# Patient Record
Sex: Male | Born: 1970
Health system: Southern US, Community
[De-identification: ages and names within clinical notes are randomized; demographics above are authoritative.]

## PROBLEM LIST (undated history)

## (undated) DIAGNOSIS — R7303 Prediabetes: Secondary | ICD-10-CM

## (undated) DIAGNOSIS — T7840XA Allergy, unspecified, initial encounter: Secondary | ICD-10-CM

## (undated) DIAGNOSIS — E739 Lactose intolerance, unspecified: Secondary | ICD-10-CM

## (undated) HISTORY — DX: Allergy, unspecified, initial encounter: T78.40XA

## (undated) HISTORY — PX: FRACTURE SURGERY: SHX138

## (undated) HISTORY — DX: Prediabetes: R73.03

## (undated) HISTORY — DX: Lactose intolerance, unspecified: E73.9

---

## 2010-01-25 ENCOUNTER — Emergency Department (HOSPITAL_COMMUNITY)
Admission: EM | Admit: 2010-01-25 | Discharge: 2010-01-26 | Payer: Self-pay | Source: Home / Self Care | Admitting: Emergency Medicine

## 2010-05-14 LAB — D-DIMER, QUANTITATIVE: D-Dimer, Quant: <0.22 ug/mL-FEU (ref 0.00–0.48)

## 2011-01-17 ENCOUNTER — Emergency Department (HOSPITAL_COMMUNITY): Payer: Self-pay

## 2011-01-17 ENCOUNTER — Encounter: Payer: Self-pay | Admitting: Emergency Medicine

## 2011-01-17 ENCOUNTER — Emergency Department (HOSPITAL_COMMUNITY)
Admission: EM | Admit: 2011-01-17 | Discharge: 2011-01-17 | Disposition: A | Discharge status: Home / Self Care | Payer: Self-pay | Attending: Emergency Medicine | Admitting: Emergency Medicine

## 2011-01-17 DIAGNOSIS — R229 Localized swelling, mass and lump, unspecified: Secondary | ICD-10-CM | POA: Insufficient documentation

## 2011-01-17 DIAGNOSIS — IMO0002 Reserved for concepts with insufficient information to code with codable children: Secondary | ICD-10-CM | POA: Insufficient documentation

## 2011-01-17 DIAGNOSIS — M79609 Pain in unspecified limb: Secondary | ICD-10-CM | POA: Insufficient documentation

## 2011-01-17 DIAGNOSIS — M795 Residual foreign body in soft tissue: Secondary | ICD-10-CM

## 2011-01-17 DIAGNOSIS — X58XXXA Exposure to other specified factors, initial encounter: Secondary | ICD-10-CM | POA: Insufficient documentation

## 2011-01-17 MED ORDER — TETANUS-DIPHTH-ACELL PERTUSSIS 5-2.5-18.5 LF-MCG/0.5 IM SUSP
0.5000 mL | Freq: Once | INTRAMUSCULAR | Status: AC
  Administered 2011-01-17: 0.5 mL via INTRAMUSCULAR
  Filled 2011-01-17: qty 0.5

## 2011-01-17 MED ORDER — CEPHALEXIN 500 MG PO CAPS: 500.0000 mg | ORAL_CAPSULE | Freq: Four times a day (QID) | ORAL | Status: AC

## 2011-01-17 NOTE — ED Notes (Signed)
Pressure dressing placed over R wrist wound. Pt tolerated without difficulty. Bleeding controlled. Wound care instructions discussed. Pt had no further questions regarding discharge.

## 2011-01-17 NOTE — ED Provider Notes (Signed)
Medical screening examination/treatment/procedure(s) were performed by non-physician practitioner and as supervising physician I was immediately available for consultation/collaboration.  Donnetta Hutching, MD 01/17/11 [phone]

## 2011-01-17 NOTE — ED Notes (Signed)
Patient transported to X-ray 

## 2011-01-17 NOTE — ED Provider Notes (Signed)
History     CSN: [id number] Arrival date & time: 01/17/2011  6:13 PM   First MD Initiated Contact with Patient 01/17/11 2029      Chief Complaint  Patient presents with  . Arm Pain  HPI: Patient is a 40 y.o. male presenting with arm pain and foreign body. The history is provided by the patient. No language interpreter was used.  Arm Pain This is a new problem. The current episode started yesterday. The problem occurs constantly. The problem has been gradually worsening. Pertinent negatives include no fever. He has tried nothing for the symptoms.  Foreign Body  The incident was reported. Pertinent negatives include no fever.  Patient reports is working on an old fence yesterday at home when a small piece of wood penetrated his right forearm. Became concerned today when patient noted the area to be somewhat swollen red and and tender to palpation.  History reviewed. No pertinent past medical history.  History reviewed. No pertinent past surgical history.  No family history on file.  History  Substance Use Topics  . Smoking status: Never Smoker   . Smokeless tobacco: Not on file  . Alcohol Use: No      Review of Systems  Constitutional: Negative.  Negative for fever.  HENT: Negative.   Eyes: Negative.   Respiratory: Negative.   Cardiovascular: Negative.   Gastrointestinal: Negative.   Genitourinary: Negative.   Musculoskeletal: Negative.   Skin: Negative.   Neurological: Negative.   Hematological: Negative.   Psychiatric/Behavioral: Negative.     Allergies  Review of patient's allergies indicates no known allergies.  Home Medications  No current outpatient prescriptions on file.  BP 122/77  Pulse 50  Temp(Src) 96.8 F (36 C) (Oral)  Resp 16  SpO2 98%  Physical Exam  Constitutional: He is oriented to person, place, and time. He appears well-developed and well-nourished.  HENT:  Head: Normocephalic and atraumatic.  Eyes: Conjunctivae are normal.  Neck:  Neck supple.  Cardiovascular: Normal rate.   Pulmonary/Chest: Effort normal and breath sounds normal.  Musculoskeletal:       Right forearm: He exhibits tenderness and swelling.       Arms:      Puncture wound noted to the radial aspect of (R) forearm with small amount of erythema and swelling surrounding the puncture wound. Able to palpate likely foreign body just beneath.  Neurological: He is alert and oriented to person, place, and time.  Skin: Skin is warm and dry.  Psychiatric: He has a normal mood and affect.    ED Course  FOREIGN BODY REMOVAL Date/Time: 01/17/2011 11:17 PM Performed by: Leanne Chang Authorized by: Leanne Chang Consent: Written consent obtained. Risks and benefits: risks, benefits and alternatives were discussed Consent given by: patient Patient understanding: patient states understanding of the procedure being performed Site marked: the operative site was marked Imaging studies: imaging studies available Patient identity confirmed: verbally with patient and arm band Intake: (R) radial forearm. Anesthesia: local infiltration Local anesthetic: lidocaine 1% without epinephrine Anesthetic total: 1 ml Patient sedated: no Patient restrained: no Complexity: simple 1 objects recovered. Objects recovered: Approx .5 cm ood (splinter) Post-procedure assessment: foreign body removed Patient tolerance: Patient tolerated the procedure well with no immediate complications. Comments: Small incision <.25cm left open. Large bulky sterile dressing applied. T-Dap updated     Labs Reviewed - No data to display Dg Forearm Right  01/17/2011  *RADIOLOGY REPORT*  Clinical Data: Evaluate for foreign body.  Laceration along the  radial aspect of the right forearm.  RIGHT FOREARM - 2 VIEW  Comparison: None.  Findings: No acute osseous abnormality.  No foreign body identified however not that wood is often not radiopaque.  IMPRESSION: No osseous abnormality.  No  radiopaque foreign body.  Note that wood can be radiographically occult.  Original Report Authenticated By: Waneta Martins, M.D.     No diagnosis found.    MDM  FB removed. Will tx w/ abx given swelling erythema.         Leanne Chang, NP 01/17/11 [phone]

## 2011-01-17 NOTE — ED Notes (Signed)
Pt states small piece of wood went into R wrist area yesterday. States he was doing wood working at home when this happened. Swollen area to R posterior wrist, area red and tender to palpation. No drainage noted. Pt conscious alert and oriented x4. No fever. No other complaints. 3/10 mild pain at the time. No signs of distress at the time.

## 2011-01-17 NOTE — ED Notes (Signed)
EDP at the bedside attempting to remove foreign body. Small piece of wood removed without difficulty. Pt tolerated without problems. Bleeding controlled. Area covered with pressure dressing.

## 2011-01-17 NOTE — ED Notes (Signed)
Patient currently sitting up in bed; no respiratory or acute distress noted.  Patient updated on plan of care; informed patient that x-ray will be by to transport patient to x-ray.  Patient has no other questions, concerns, or requests at this time.  Will continue to monitor.

## 2011-01-17 NOTE — ED Notes (Signed)
Pt states a small piece of wood went into R forearm while working at home yesterday.  C/o pain and swelling.

## 2011-06-29 ENCOUNTER — Ambulatory Visit (INDEPENDENT_AMBULATORY_CARE_PROVIDER_SITE_OTHER): Payer: BC Managed Care – PPO | Admitting: Physician Assistant

## 2011-06-29 DIAGNOSIS — R062 Wheezing: Secondary | ICD-10-CM

## 2011-06-29 DIAGNOSIS — J309 Allergic rhinitis, unspecified: Secondary | ICD-10-CM | POA: Insufficient documentation

## 2011-06-29 DIAGNOSIS — J45909 Unspecified asthma, uncomplicated: Secondary | ICD-10-CM

## 2011-06-29 DIAGNOSIS — J301 Allergic rhinitis due to pollen: Secondary | ICD-10-CM

## 2011-06-29 MED ORDER — ALBUTEROL SULFATE HFA 108 (90 BASE) MCG/ACT IN AERS: 2.0000 | INHALATION_SPRAY | RESPIRATORY_TRACT | Status: DC | PRN

## 2011-06-29 MED ORDER — ALBUTEROL SULFATE (2.5 MG/3ML) 0.083% IN NEBU
2.5000 mg | INHALATION_SOLUTION | Freq: Once | RESPIRATORY_TRACT | Status: AC
  Administered 2011-06-29: 2.5 mg via RESPIRATORY_TRACT

## 2011-06-29 MED ORDER — PREDNISONE 20 MG PO TABS: ORAL_TABLET | ORAL | Status: AC

## 2011-06-29 MED ORDER — OLOPATADINE HCL 0.2 % OP SOLN: OPHTHALMIC | Status: DC

## 2011-06-29 MED ORDER — SODIUM CHLORIDE 0.9 % IV SOLN: 125.0000 mg | Freq: Once | INTRAVENOUS | Status: DC

## 2011-06-29 MED ORDER — FLUTICASONE PROPIONATE 50 MCG/ACT NA SUSP: 2.0000 | Freq: Every day | NASAL | Status: DC

## 2011-06-29 MED ORDER — IPRATROPIUM BROMIDE 0.02 % IN SOLN
0.5000 mg | Freq: Once | RESPIRATORY_TRACT | Status: AC
  Administered 2011-06-29: 0.5 mg via RESPIRATORY_TRACT

## 2011-06-29 MED ORDER — SODIUM CHLORIDE 0.9 % IV SOLN
125.0000 mg | Freq: Once | INTRAVENOUS | Status: AC
  Administered 2011-06-29: 130 mg via INTRAMUSCULAR

## 2011-06-29 NOTE — Progress Notes (Signed)
Patient ID: Bradley Flores MRN: [id number], DOB: October 10, 1970, 41 y.o. Date of Encounter: 06/29/2011, 6:22 PM  Primary Physician: No primary provider on file.  Chief Complaint: Allergic rhinitis and asthma  HPI: 41 y.o. year old male with history below presents with flare up of allergic rhinitis and asthma exacerbation. Patient reports he has experienced for the past 7 days symptoms of sneezing, watery/itchy eyes, nasal congestion, rhinorrhea, PND, and ST. Afebrile. Mild cough coupled with some SOB and wheezing. He does have a history of asthma, although this has been well controlled for some time. His asthma has previously been treated with albuterol successfully. He denies any CP, palpitations, or edema. No recent sedentary periods, leg trauma, tobacco use, or history of CA.   Past Medical History  Diagnosis Date  . Asthma   . Allergic rhinitis      Home Meds: Prior to Admission medications   Not on File    Allergies: No Known Allergies  History   Social History  . Marital Status: Married    Spouse Name: N/A    Number of Children: N/A  . Years of Education: N/A   Occupational History  . Not on file.   Social History Main Topics  . Smoking status: Never Smoker   . Smokeless tobacco: Not on file  . Alcohol Use: No  . Drug Use: No  . Sexually Active:    Other Topics Concern  . Not on file   Social History Narrative  . No narrative on file     Review of Systems: Constitutional: negative for chills, fever, night sweats, weight changes, or fatigue  HEENT: negative for vision changes, hearing loss, congestion, rhinorrhea, ST, epistaxis, or sinus pressure Cardiovascular: negative for chest pain or palpitations Respiratory: negative for hemoptysis Abdominal: negative for abdominal pain, nausea, vomiting, diarrhea, or constipation Dermatological: negative for rash Neurologic: negative for headache, dizziness, or syncope All other systems reviewed and are otherwise  negative with the exception to those above and in the HPI.   Physical Exam: Blood pressure 127/69, pulse 68, temperature 98.7 F (37.1 C), temperature source Oral, resp. rate 16, height 6' 2.18" (1.884 m), weight 223 lb 9.6 oz (101.424 kg), SpO2 97.00%., Body mass index is 28.57 kg/(m^2). General: Well developed, well nourished, in no acute distress. Head: Normocephalic, atraumatic, eyes without discharge, sclera non-icteric, nares are pale and boggy. Bilateral auditory canals clear, TM's are without perforation, pearly grey and translucent with reflective cone of light bilaterally. Oral cavity moist, posterior pharynx without exudate, erythema, peritonsillar abscess, or post nasal drip.  Neck: Supple. No thyromegaly. Full ROM. No lymphadenopathy. Lungs: Clear bilaterally to auscultation with inspiratory and expiratory wheezes. No rales or rhonchi. Breathing is unlabored. Heart: RRR with S1 S2. No murmurs, rubs, or gallops appreciated. Msk:  Strength and tone normal for age. Extremities/Skin: Warm and dry. No clubbing or cyanosis. No edema. No rashes or suspicious lesions. Neuro: Alert and oriented X 3. Moves all extremities spontaneously. Gait is normal. CNII-XII grossly in tact. Psych:  Responds to questions appropriately with a normal affect.   S/P Albuterol neb: Patient feels better. No further wheezing to auscultation.  ASSESSMENT AND PLAN:  41 y.o. year old male with allergic rhinitis and asthma. -Albuterol and Atrovent neb in office -SoluMedrol 125 mg IM in office -Flonase 2 sprays each nare daily #1 RF 6 -Pataday #1 1 gtt daily RF 6 -Prednisone 20 mg #18 3x3, 2x3, 1x3 no RF -Proventil 2 puffs inhaled q 4-6 hours prn #1 RF  1 -RTC precautions -Recheck 48 hours     Signed, Eula Listen, PA-C 06/29/2011 6:22 PM

## 2012-06-14 ENCOUNTER — Ambulatory Visit (INDEPENDENT_AMBULATORY_CARE_PROVIDER_SITE_OTHER): Payer: BC Managed Care – PPO | Admitting: Emergency Medicine

## 2012-06-14 VITALS — BP 135/80 | HR 75 | Temp 98.8°F | Resp 16 | Ht 74.18 in | Wt 223.0 lb

## 2012-06-14 DIAGNOSIS — H1045 Other chronic allergic conjunctivitis: Secondary | ICD-10-CM

## 2012-06-14 DIAGNOSIS — J309 Allergic rhinitis, unspecified: Secondary | ICD-10-CM

## 2012-06-14 DIAGNOSIS — H1013 Acute atopic conjunctivitis, bilateral: Secondary | ICD-10-CM

## 2012-06-14 DIAGNOSIS — R062 Wheezing: Secondary | ICD-10-CM

## 2012-06-14 MED ORDER — FLUTICASONE PROPIONATE 50 MCG/ACT NA SUSP: 2.0000 | Freq: Every day | NASAL | Status: AC

## 2012-06-14 MED ORDER — OLOPATADINE HCL 0.2 % OP SOLN: OPHTHALMIC | Status: DC

## 2012-06-14 NOTE — Patient Instructions (Addendum)
Allergic Rhinitis Allergic rhinitis is when the mucous membranes in the nose respond to allergens. Allergens are particles in the air that cause your body to have an allergic reaction. This causes you to release allergic antibodies. Through a chain of events, these eventually cause you to release histamine into the blood stream (hence the use of antihistamines). Although meant to be protective to the body, it is this release that causes your discomfort, such as frequent sneezing, congestion and an itchy runny nose.  CAUSES  The pollen allergens may come from grasses, trees, and weeds. This is seasonal allergic rhinitis, or "hay fever." Other allergens cause year-round allergic rhinitis (perennial allergic rhinitis) such as house dust mite allergen, pet dander and mold spores.  SYMPTOMS   Nasal stuffiness (congestion).  Runny, itchy nose with sneezing and tearing of the eyes.  There is often an itching of the mouth, eyes and ears. It cannot be cured, but it can be controlled with medications. DIAGNOSIS  If you are unable to determine the offending allergen, skin or blood testing may find it. TREATMENT   Avoid the allergen.  Medications and allergy shots (immunotherapy) can help.  Hay fever may often be treated with antihistamines in pill or nasal spray forms. Antihistamines block the effects of histamine. There are over-the-counter medicines that may help with nasal congestion and swelling around the eyes. Check with your caregiver before taking or giving this medicine. If the treatment above does not work, there are many new medications your caregiver can prescribe. Stronger medications may be used if initial measures are ineffective. Desensitizing injections can be used if medications and avoidance fails. Desensitization is when a patient is given ongoing shots until the body becomes less sensitive to the allergen. Make sure you follow up with your caregiver if problems continue. SEEK MEDICAL  CARE IF:   You develop fever (more than 100.5 F (38.1 C).  You develop a cough that does not stop easily (persistent).  You have shortness of breath.  You start wheezing.  Symptoms interfere with normal daily activities. Document Released: 11/12/2000 Document Revised: 05/12/2011 Document Reviewed: 05/24/2008 [facility] Patient Information 2013 [place], [place]. Allergic Conjunctivitis The conjunctiva is a thin membrane that covers the visible white part of the eyeball and the underside of the eyelids. This membrane protects and lubricates the eye. The membrane has small blood vessels running through it that can normally be seen. When the conjunctiva becomes inflamed, the condition is called conjunctivitis. In response to the inflammation, the conjunctival blood vessels become swollen. The swelling results in redness in the normally white part of the eye. The blood vessels of this membrane also react when a person has allergies and is then called allergic conjunctivitis. This condition usually lasts for as long as the allergy persists. Allergic conjunctivitis cannot be passed to another person (non-contagious). The likelihood of bacterial infection is great and the cause is not likely due to allergies if the inflamed eye has:  A sticky discharge.  Discharge or sticking together of the lids in the morning.  Scaling or flaking of the eyelids where the eyelashes come out.  Red swollen eyelids. CAUSES   Viruses.  Irritants such as foreign bodies.  Chemicals.  General allergic reactions.  Inflammation or serious diseases in the inside or the outside of the eye or the orbit (the boney cavity in which the eye sits) can cause a "red eye." SYMPTOMS   Eye redness.  Tearing.  Itchy eyes.  Burning feeling in the eyes.  Clear drainage from the eye.  Allergic reaction due to pollens or ragweed sensitivity. Seasonal allergic conjunctivitis is frequent in the spring when pollens are in  the air and in the fall. DIAGNOSIS  This condition, in its many forms, is usually diagnosed based on the history and an ophthalmological exam. It usually involves both eyes. If your eyes react at the same time every year, allergies may be the cause. While most "red eyes" are due to allergy or an infection, the role of an eye (ophthalmological) exam is important. The exam can rule out serious diseases of the eye or orbit. TREATMENT   Non-antibiotic eye drops, ointments, or medications by mouth may be prescribed if the ophthalmologist is sure the conjunctivitis is due to allergies alone.  Over-the-counter drops and ointments for allergic symptoms should be used only after other causes of conjunctivitis have been ruled out, or as your caregiver suggests. Medications by mouth are often prescribed if other allergy-related symptoms are present. If the ophthalmologist is sure that the conjunctivitis is due to allergies alone, treatment is normally limited to drops or ointments to reduce itching and burning. HOME CARE INSTRUCTIONS   Wash hands before and after applying drops or ointments, or touching the inflamed eye(s) or eyelids.  Do not let the eye dropper tip or ointment tube touch the eyelid when putting medicine in your eye.  Stop using your soft contact lenses and throw them away. Use a new pair of lenses when recovery is complete. You should run through sterilizing cycles at least three times before use after complete recovery if the old soft contact lenses are to be used. Hard contact lenses should be stopped. They need to be thoroughly sterilized before use after recovery.  Itching and burning eyes due to allergies is often relieved by using a cool cloth applied to closed eye(s). SEEK MEDICAL CARE IF:   Your problems do not go away after two or three days of treatment.  Your lids are sticky (especially in the morning when you wake up) or stick together.  Discharge develops. Antibiotics may  be needed either as drops, ointment, or by mouth.  You have extreme light sensitivity.  An oral temperature above 102 F (38.9 C) develops.  Pain in or around the eye or any other visual symptom develops. MAKE SURE YOU:   Understand these instructions.  Will watch your condition.  Will get help right away if you are not doing well or get worse. Document Released: 05/10/2002 Document Revised: 05/12/2011 Document Reviewed: 04/05/2007 [facility] Patient Information 2013 [place], [place].

## 2012-06-14 NOTE — Progress Notes (Signed)
[facility] [address] [place] [address] [phone]  Date:  06/14/2012   Name:  Bradley Flores   DOB:  [birth year]   MRN:  [id number]  PCP:  No primary provider on file.    Chief Complaint: Allergies   History of Present Illness:  Bradley Flores is a 42 y.o. very pleasant male patient who presents with the following:  Ill since Thursday with watery itchy eyes, red, swollen lids.  Has clear watery nasal drainage.  No fever or chills.  No cough, wheezing or shortness of breath.  No nausea or vomiting.  History of same with wheezing last April.  No improvement with over the counter medications or other home remedies. Denies other complaint or health concern today.   Patient Active Problem List  Diagnosis  . Allergic rhinitis    Past Medical History  Diagnosis Date  . Asthma   . Allergic rhinitis     History reviewed. No pertinent past surgical history.  History  Substance Use Topics  . Smoking status: Never Smoker   . Smokeless tobacco: Never Used  . Alcohol Use: No    Family History  Problem Relation Age of Onset  . Hypertension Mother   . Diabetes Father     No Known Allergies  Medication list has been reviewed and updated.  Current Outpatient Prescriptions on File Prior to Visit  Medication Sig Dispense Refill  . albuterol (PROVENTIL HFA;VENTOLIN HFA) 108 (90 BASE) MCG/ACT inhaler Inhale 2 puffs into the lungs every 4 (four) hours as needed for wheezing.  1 Inhaler  1   Current Facility-Administered Medications on File Prior to Visit  Medication Dose Route Frequency Provider Last Rate Last Dose  . methylPREDNISolone sodium succinate (SOLU-MEDROL) 130 mg in sodium chloride 0.9 % 50 mL IVPB  130 mg Intravenous Once Sondra Barges, PA-C        Review of Systems:  As per HPI, otherwise negative.    Physical Examination: Filed Vitals:   06/14/12 1610  BP: 135/80  Pulse: 75  Temp: 98.8 F (37.1 C)  Resp: 16   Filed Vitals:   06/14/12 1610  Height: 6' 2.18" (1.884 m)  Weight: 223 lb (101.152 kg)   Body mass index is 28.5 kg/(m^2). Ideal Body Weight: Weight in (lb) to have BMI = 25: 195.3  GEN: WDWN, NAD, Non-toxic, A & O x 3 HEENT: Atraumatic, Normocephalic. Neck supple. No masses, No LAD. Ears and Nose: No external deformity.  Watery drainage CV: RRR, No M/G/R. No JVD. No thrill. No extra heart sounds. PULM: CTA B, no wheezes, crackles, rhonchi. No retractions. No resp. distress. No accessory muscle use. ABD: S, NT, ND, +BS. No rebound. No HSM. EXTR: No c/c/e NEURO Normal gait.  PSYCH: Normally interactive. Conversant. Not depressed or anxious appearing.  Calm demeanor.    Assessment and Plan: Allergic rhinitis Allergic conjunctivitis claritin flonase patanase Follow up as needed   Signed,  Phillips Odor, MD

## 2012-08-23 ENCOUNTER — Ambulatory Visit (INDEPENDENT_AMBULATORY_CARE_PROVIDER_SITE_OTHER): Payer: BC Managed Care – PPO | Admitting: Family Medicine

## 2012-08-23 ENCOUNTER — Ambulatory Visit: Payer: BC Managed Care – PPO

## 2012-08-23 VITALS — BP 132/78 | HR 82 | Temp 98.2°F | Resp 18 | Ht 75.0 in | Wt 219.0 lb

## 2012-08-23 DIAGNOSIS — M25552 Pain in left hip: Secondary | ICD-10-CM

## 2012-08-23 DIAGNOSIS — M25559 Pain in unspecified hip: Secondary | ICD-10-CM

## 2012-08-23 DIAGNOSIS — M5432 Sciatica, left side: Secondary | ICD-10-CM

## 2012-08-23 DIAGNOSIS — M531 Cervicobrachial syndrome: Secondary | ICD-10-CM

## 2012-08-23 MED ORDER — PREDNISONE 20 MG PO TABS: ORAL_TABLET | ORAL | Status: DC

## 2012-08-23 MED ORDER — HYDROCODONE-ACETAMINOPHEN 5-325 MG PO TABS: 1.0000 | ORAL_TABLET | Freq: Four times a day (QID) | ORAL | Status: DC | PRN

## 2012-08-23 NOTE — Progress Notes (Signed)
[facility] [address] [place] [address] [phone]  Date:  08/23/2012   Name:  Bradley Flores   DOB:  Apr 26, 1970   MRN:  [id number]  PCP:  No primary provider on file.    Chief Complaint: Hip Pain   History of Present Illness:  Bradley Flores is a 42 y.o. very pleasant male patient who presents with the following:  Last here in April of this year wih allergies- he is generally healthy.  This past Saturday (today is Monday) he developed pain in his left buttock and down the left leg.  It hurts to sit on the toilet, or to raise his leg.  He is most comfortable standing up.   No injury or over- use that he can recall.  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   Last night he took some tylenol for a "slight fever and HA," but these sx are now resolved.  He feels weak all over, no particualr leg weakness.  No numbness/ sensation change, and no incontinence of his bowel or bladder.   He is able to walk but it hurts.    Patient Active Problem List   Diagnosis Date Noted  . Allergic rhinitis     Past Medical History  Diagnosis Date  . Asthma   . Allergic rhinitis     No past surgical history on file.  History  Substance Use Topics  . Smoking status: Never Smoker   . Smokeless tobacco: Never Used  . Alcohol Use: No    Family History  Problem Relation Age of Onset  . Hypertension Mother   . Diabetes Father     No Known Allergies  Medication list has been reviewed and updated.  Current Outpatient Prescriptions on File Prior to Visit  Medication Sig Dispense Refill  . albuterol (PROVENTIL HFA;VENTOLIN HFA) 108 (90 BASE) MCG/ACT inhaler Inhale 2 puffs into the lungs every 4 (four) hours as needed for wheezing.  1 Inhaler  1  . fluticasone (FLONASE) 50 MCG/ACT nasal spray Place 2 sprays into the nose daily.  16 g  6  . loratadine (CLARITIN) 10 MG tablet Take 10 mg by mouth daily.       . Olopatadine HCl (PATADAY) 0.2 % SOLN 1 gtt daily  1 Bottle  6   Current Facility-Administered Medications on File Prior to Visit  Medication Dose Route Frequency Provider Last Rate Last Dose  . methylPREDNISolone sodium succinate (SOLU-MEDROL) 130 mg in sodium chloride 0.9 % 50 mL IVPB  130 mg Intravenous Once Sondra Barges, PA-C        Review of Systems:  As per HPI- otherwise negative.   Physical Examination: Filed Vitals:   08/23/12 0934  BP: 132/78  Pulse: 82  Temp: 98.2 F (36.8 C)  Resp: 18   Filed Vitals:   08/23/12 0934  Height: 6\' 3"  (1.905 m)  Weight: 219 lb (99.338 kg)   Body mass index is 27.37 kg/(m^2). Ideal Body Weight: Weight in (lb) to have BMI = 25: 199.6  GEN: WDWN, NAD, Non-toxic, A & O x 3, looks well, favoring left leg HEENT: Atraumatic, Normocephalic. Neck supple. No masses, No LAD. Ears and Nose: No external deformity. CV: RRR, No M/G/R. No JVD. No thrill. No extra heart sounds. PULM: CTA B, no wheezes, crackles, rhonchi. No retractions. No resp. distress. No accessory muscle use. ABD: S, NT, ND, +BS.  No rebound. No HSM. EXTR: No c/c/e. He has normal strength, sensation and DTR of both LE.  No saddle anesthesia NEURO favoring left leg PSYCH: Normally interactive. Conversant. Not depressed or anxious appearing.  Calm demeanor.  Back:  Limited forward flexion.  Tender to palpation over the left sciatic notch and buttock.  Positive SLR on the left- causes discomfort    [facility] reading (PRIMARY) by  Dr. Patsy Lager. Lumbar spine:  negative Left hip: negative  LEFT HIP - COMPLETE 2+ VIEW  Comparison: Lumbar radiographs from the same date.  Findings: Bone mineralization is within normal limits. Hip joint spaces are preserved. Proximal left femur intact. Left SI joint within normal limits. Pelvis intact.  IMPRESSION: No acute osseous abnormality identified about the left hip.  Clinically significant discrepancy from primary report, if provided:  None  *RADIOLOGY REPORT*  Clinical Data: 42 year old male with pain radiating to the left buttock and lower extremity.  LUMBAR SPINE - COMPLETE 4+ VIEW  Comparison: Chest radiographs 01/26/2010.  Findings: 12 full size ribs demonstrated on the comparison. Therefore, the S1 level is lumbarized.  Bone mineralization is within normal limits. Lumbar vertebral height and alignment within normal limits. Disc spaces relatively preserved. No pars fracture. Sacral ala and SI joints within normal limits.  IMPRESSION: Negative radiographic appearance of the lumbar spine except for transitional anatomy with a lumbarized S1 level and full size S1-S2 disc space.  Clinically significant discrepancy from primary report, if provided: None   Assessment and Plan: Sciatica, left - Plan: predniSONE (DELTASONE) 20 MG tablet, HYDROcodone-acetaminophen (NORCO/VICODIN) 5-325 MG per tablet  Hip pain, left - Plan: DG Lumbar Spine Complete, DG Hip Complete Left  Left sided sciatica.  Treat with oral prednisone and pain control. Close follow- up if not better.  See patient instructions for more details.     Signed Abbe Amsterdam, MD

## 2012-08-23 NOTE — Patient Instructions (Addendum)
It appears that you have sciatica- pain in the nerve down the left hip.  We are going to treat this with prednisone and pain medication.  Remember the pain medication can make you sleepy so do not take it when you need to drive or operate machinery.    If you are not better in the next 2 days please let me know- sooner if you get worse or develop any numbness.   Sciatica Sciatica is pain, weakness, numbness, or tingling along the path of the sciatic nerve. The nerve starts in the lower back and runs down the back of each leg. The nerve controls the muscles in the lower leg and in the back of the knee, while also providing sensation to the back of the thigh, lower leg, and the sole of your foot. Sciatica is a symptom of another medical condition. For instance, nerve damage or certain conditions, such as a herniated disk or bone spur on the spine, pinch or put pressure on the sciatic nerve. This causes the pain, weakness, or other sensations normally associated with sciatica. Generally, sciatica only affects one side of the body. CAUSES   Herniated or slipped disc.  Degenerative disk disease.  A pain disorder involving the narrow muscle in the buttocks (piriformis syndrome).  Pelvic injury or fracture.  Pregnancy.  Tumor (rare). SYMPTOMS  Symptoms can vary from mild to very severe. The symptoms usually travel from the low back to the buttocks and down the back of the leg. Symptoms can include:  Mild tingling or dull aches in the lower back, leg, or hip.  Numbness in the back of the calf or sole of the foot.  Burning sensations in the lower back, leg, or hip.  Sharp pains in the lower back, leg, or hip.  Leg weakness.  Severe back pain inhibiting movement. These symptoms may get worse with coughing, sneezing, laughing, or prolonged sitting or standing. Also, being overweight may worsen symptoms. DIAGNOSIS  Your caregiver will perform a physical exam to look for common symptoms of  sciatica. He or she may ask you to do certain movements or activities that would trigger sciatic nerve pain. Other tests may be performed to find the cause of the sciatica. These may include:  Blood tests.  X-rays.  Imaging tests, such as an MRI or CT scan. TREATMENT  Treatment is directed at the cause of the sciatic pain. Sometimes, treatment is not necessary and the pain and discomfort goes away on its own. If treatment is needed, your caregiver may suggest:  Over-the-counter medicines to relieve pain.  Prescription medicines, such as anti-inflammatory medicine, muscle relaxants, or narcotics.  Applying heat or ice to the painful area.  Steroid injections to lessen pain, irritation, and inflammation around the nerve.  Reducing activity during periods of pain.  Exercising and stretching to strengthen your abdomen and improve flexibility of your spine. Your caregiver may suggest losing weight if the extra weight makes the back pain worse.  Physical therapy.  Surgery to eliminate what is pressing or pinching the nerve, such as a bone spur or part of a herniated disk. HOME CARE INSTRUCTIONS   Only take over-the-counter or prescription medicines for pain or discomfort as directed by your caregiver.  Apply ice to the affected area for 20 minutes, 3 4 times a day for the first 48 72 hours. Then try heat in the same way.  Exercise, stretch, or perform your usual activities if these do not aggravate your pain.  Attend physical  therapy sessions as directed by your caregiver.  Keep all follow-up appointments as directed by your caregiver.  Do not wear high heels or shoes that do not provide proper support.  Check your mattress to see if it is too soft. A firm mattress may lessen your pain and discomfort. SEEK IMMEDIATE MEDICAL CARE IF:   You lose control of your bowel or bladder (incontinence).  You have increasing weakness in the lower back, pelvis, buttocks, or legs.  You have  redness or swelling of your back.  You have a burning sensation when you urinate.  You have pain that gets worse when you lie down or awakens you at night.  Your pain is worse than you have experienced in the past.  Your pain is lasting longer than 4 weeks.  You are suddenly losing weight without reason. MAKE SURE YOU:  Understand these instructions.  Will watch your condition.  Will get help right away if you are not doing well or get worse. Document Released: 02/11/2001 Document Revised: 08/19/2011 Document Reviewed: 06/29/2011 [facility] Patient Information 2014 [place], [place].

## 2014-01-17 ENCOUNTER — Encounter: Payer: Self-pay | Admitting: Family Medicine

## 2014-01-17 ENCOUNTER — Ambulatory Visit (INDEPENDENT_AMBULATORY_CARE_PROVIDER_SITE_OTHER): Payer: BC Managed Care – PPO | Admitting: Family Medicine

## 2014-01-17 VITALS — BP 123/72 | HR 64 | Temp 98.2°F | Ht 75.0 in | Wt 227.0 lb

## 2014-01-17 DIAGNOSIS — R7309 Other abnormal glucose: Secondary | ICD-10-CM

## 2014-01-17 DIAGNOSIS — H539 Unspecified visual disturbance: Secondary | ICD-10-CM

## 2014-01-17 DIAGNOSIS — K1370 Unspecified lesions of oral mucosa: Secondary | ICD-10-CM | POA: Insufficient documentation

## 2014-01-17 DIAGNOSIS — R7303 Prediabetes: Secondary | ICD-10-CM | POA: Insufficient documentation

## 2014-01-17 NOTE — Assessment & Plan Note (Signed)
Patient with family history of father and sister with diagnosed type 2 diabetes. Patient had elevated A1c of 5.9 through a lab test performed by his employer. A shunt has reported some recent nocturia and urinary frequency. These symptoms may be secondary to possible development of diabetic state.  - I've asked patient to follow-up with me in 6 months. At which time I will obtain a A1c and possibly a fasting glucose. - We will follow-up.

## 2014-01-17 NOTE — Progress Notes (Signed)
Mickie HillierIan Cherrise Occhipinti, MD, MS Phone: [phone]  Subjective:  Chief complaint  Pt Here for establishment of care. Patient is here to establish care with our clinic. Patient is a relatively healthy 43 year old male immigrant [place] with no significant past medical history. Reports no significance issues at this time. Patient lives with his wife and 2 kids. 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 Patient does not drink she does not smoke. Patient states that life at home is very positive. He states that he enjoys his job and has little stress.  Patient has a significant past surgical history for left shoulder surgery. Surgery occurred after dislocation of the glenohumeral joint. Patient does not know the specifics of the surgery or whether or not he has any rotator cuff pathology.  Patient brought in previous lab work he had obtained through his work. Lab values were within normal limits with the exception of a slightly elevated A1c of 5.9. Patient states that he has a the father and sister with diagnosed type 2 diabetes. No prior history of hyperglycemia reported by patient.  Patient also had an oral lesion in his posterior left hard palate mucosa. Patient states that his been present for approximately 3-4 days. Lesion is painful with swallowing. Patient states that he drinks hot tea regularly and is prone to burning in his mouth, he however states that this lesion is not related to a burn. He states that it sometimes bleeds when he touches it with a toothbrush. He has not had any lesion like this in the past. Pain is localized only to the lesion and does not extend into the oropharynx or pharynx.  Patient did report some urinary frequency/nocturia experienced recently. At this visit we did not have enough time to cover this issue. It should be noted and addressed at a follow-up appointment.  Review of Systems  Constitutional: Negative.   HENT: Negative.   Eyes: Negative.     Respiratory: Negative.   Cardiovascular: Negative.   Gastrointestinal: Negative.   Genitourinary: Positive for frequency. Negative for urgency.  Musculoskeletal: Positive for joint pain. Negative for myalgias, back pain, falls and neck pain.  Skin: Negative.   Neurological: Negative.   Psychiatric/Behavioral: Negative.      Past Medical History Patient Active Problem List   Diagnosis Date Noted  . Oral mucosal lesion 01/17/2014  . Vision changes 01/17/2014  . Prediabetes 01/17/2014    Medications- reviewed and updated No current outpatient prescriptions on file.   No current facility-administered medications for this visit.    Objective: BP 123/72 mmHg  Pulse 64  Temp(Src) 98.2 F (36.8 C) (Oral)  Ht 6\' 3"  (1.905 m)  Wt 227 lb (102.967 kg)  BMI 28.37 kg/m2 Gen: NAD, alert, cooperative with exam HEENT: NCAT, EOMI, PERRL CV: RRR, good S1/S2, no murmur Resp: CTABL, no wheezes, non-labored Abd: Soft, Non Tender, Non Distended, BS present, no guarding or organomegaly Ext: No edema, warm Neuro: Alert and oriented, No gross deficits   Assessment/Plan:  Oral mucosal lesion Patient presented with left posterior hard palate localized lesion. Lesion was painful in nature did not extend beyond the borders of the lesion itself. Patient does not have many significant risk factors for oral carcinoma--does not smoke does not drink alcohol does not chew. Patient does regularly drink hot tea which can predispose individuals to esophageal carcinomas however I am unsure whether he can put someone at risk for oral neoplasms.  Lesion has been present for approximately 4 days.  This is its first presentation. - Likely viral in nature; will continue to monitor. - Patient instructed to contact our office and set up an appointment with a dentist if lesion does not resolve in 10-14 days. No further workup necessary at this time.  Vision changes Patient reports some vision changes and  difficulty reading over the past few months. I exam was performed in our office and was found to be within normal limits.  - Patient advised to seek care at our clinic for the possibility of initiation of glasses or contact use. - Will follow-up  Prediabetes Patient with family history of father and sister with diagnosed type 2 diabetes. Patient had elevated A1c of 5.9 through a lab test performed by his employer. A shunt has reported some recent nocturia and urinary frequency. These symptoms may be secondary to possible development of diabetic state.  - I've asked patient to follow-up with me in 6 months. At which time I will obtain a A1c and possibly a fasting glucose. - We will follow-up.    No orders of the defined types were placed in this encounter.    No orders of the defined types were placed in this encounter.

## 2014-01-17 NOTE — Patient Instructions (Signed)
It was a pleasure seeing you today in our clinic. Today we discussed establishing care with our clinic. Here is the treatment plan we have discussed and agreed upon together:   - I would like you to monitor the sore that you have in your mouth. If this does not resolve in 10-14 days please contact our office. - I think he would benefit from setting up an appointment with the dentist in the near future. If the sore in her mouth does not resolve this would be the first place I would send you. - Today in our office in her vision test was normal.  If you're having issues reading because of vision changes would recommend setting up an appointment with an eye clinic to have this more thoroughly examined.   Is a pleasure to meet you today I look forward to managing your care long-term. Please do not hesitate to contact our office if she have any questions in the future.

## 2014-01-17 NOTE — Assessment & Plan Note (Signed)
Patient presented with left posterior hard palate localized lesion. Lesion was painful in nature did not extend beyond the borders of the lesion itself. Patient does not have many significant risk factors for oral carcinoma--does not smoke does not drink alcohol does not chew. Patient does regularly drink hot tea which can predispose individuals to esophageal carcinomas however I am unsure whether he can put someone at risk for oral neoplasms.  Lesion has been present for approximately 4 days. This is its first presentation. - Likely viral in nature; will continue to monitor. - Patient instructed to contact our office and set up an appointment with a dentist if lesion does not resolve in 10-14 days. No further workup necessary at this time.

## 2014-01-17 NOTE — Assessment & Plan Note (Signed)
Patient reports some vision changes and difficulty reading over the past few months. I exam was performed in our office and was found to be within normal limits.  - Patient advised to seek care at our clinic for the possibility of initiation of glasses or contact use. - Will follow-up

## 2014-06-29 ENCOUNTER — Ambulatory Visit (INDEPENDENT_AMBULATORY_CARE_PROVIDER_SITE_OTHER): Payer: BLUE CROSS/BLUE SHIELD | Admitting: Family Medicine

## 2014-06-29 ENCOUNTER — Encounter: Payer: Self-pay | Admitting: Family Medicine

## 2014-06-29 ENCOUNTER — Other Ambulatory Visit: Payer: Self-pay | Admitting: *Deleted

## 2014-06-29 VITALS — BP 118/88 | HR 80 | Temp 98.1°F | Ht 75.0 in | Wt 235.1 lb

## 2014-06-29 DIAGNOSIS — J301 Allergic rhinitis due to pollen: Secondary | ICD-10-CM | POA: Diagnosis not present

## 2014-06-29 MED ORDER — OLOPATADINE HCL 0.2 % OP SOLN: 2.0000 [drp] | Freq: Three times a day (TID) | OPHTHALMIC | Status: DC | PRN

## 2014-06-29 MED ORDER — MOMETASONE FUROATE 50 MCG/ACT NA SUSP: 2.0000 | Freq: Every day | NASAL | Status: DC

## 2014-06-29 NOTE — Patient Instructions (Signed)
  Mucinex 600 mg extra strength twice per day Nasal Saline

## 2014-06-29 NOTE — Progress Notes (Signed)
  Subjective:     Bradley Flores is a 44 y.o. male who presents for evaluation and treatment of allergic symptoms. Symptoms include: clear rhinorrhea, headaches, nasal congestion, sneezing and wheezing and are present in a seasonal pattern. Precipitants include: pollen. Treatment currently includes nasal saline, oral antihistamines: claritin and is not effective. The following portions of the patient's history were reviewed and updated as appropriate: allergies, current medications, past family history, past medical history, past social history, past surgical history and problem list.  Review of Systems Pertinent items are noted in HPI.    Objective:    BP 118/88 mmHg  Pulse 80  Temp(Src) 98.1 F (36.7 C) (Oral)  Ht 6\' 3"  (1.905 m)  Wt 235 lb 1.6 oz (106.641 kg)  BMI 29.39 kg/m2 General appearance: alert, cooperative and appears stated age Head: Normocephalic, without obvious abnormality, atraumatic Eyes: conjunctivae/corneas clear. PERRL, EOM's intact. Fundi benign. Ears: normal TM's and external ear canals both ears Nose: turbinates red, swollen Throat: lips, mucosa, and tongue normal; teeth and gums normal Lungs: clear to auscultation bilaterally Heart: regular rate and rhythm    Assessment:    Allergic rhinitis.    Plan:    Medications: nasal saline, intranasal steroids: flonase, eye drops:  pataday. Allergen avoidance discussed. Follow-up in a few weeks.

## 2014-09-12 ENCOUNTER — Ambulatory Visit (INDEPENDENT_AMBULATORY_CARE_PROVIDER_SITE_OTHER): Payer: BLUE CROSS/BLUE SHIELD | Admitting: Emergency Medicine

## 2014-09-12 VITALS — BP 124/88 | HR 64 | Temp 98.0°F | Resp 15 | Ht 75.0 in | Wt 225.0 lb

## 2014-09-12 DIAGNOSIS — J0140 Acute pansinusitis, unspecified: Secondary | ICD-10-CM

## 2014-09-12 DIAGNOSIS — J4531 Mild persistent asthma with (acute) exacerbation: Secondary | ICD-10-CM | POA: Diagnosis not present

## 2014-09-12 DIAGNOSIS — J209 Acute bronchitis, unspecified: Secondary | ICD-10-CM | POA: Diagnosis not present

## 2014-09-12 MED ORDER — HYDROCOD POLST-CPM POLST ER 10-8 MG/5ML PO SUER: 5.0000 mL | Freq: Two times a day (BID) | ORAL | Status: DC

## 2014-09-12 MED ORDER — ALBUTEROL SULFATE (2.5 MG/3ML) 0.083% IN NEBU: 2.5000 mg | INHALATION_SOLUTION | Freq: Once | RESPIRATORY_TRACT | Status: DC

## 2014-09-12 MED ORDER — AMOXICILLIN-POT CLAVULANATE 875-125 MG PO TABS: 1.0000 | ORAL_TABLET | Freq: Two times a day (BID) | ORAL | Status: DC

## 2014-09-12 MED ORDER — ALBUTEROL SULFATE (2.5 MG/3ML) 0.083% IN NEBU
5.0000 mg | INHALATION_SOLUTION | Freq: Once | RESPIRATORY_TRACT | Status: AC
  Administered 2014-09-12: 5 mg via RESPIRATORY_TRACT

## 2014-09-12 MED ORDER — ALBUTEROL SULFATE HFA 108 (90 BASE) MCG/ACT IN AERS: 2.0000 | INHALATION_SPRAY | RESPIRATORY_TRACT | Status: DC | PRN

## 2014-09-12 MED ORDER — PSEUDOEPHEDRINE-GUAIFENESIN ER 60-600 MG PO TB12: 1.0000 | ORAL_TABLET | Freq: Two times a day (BID) | ORAL | Status: DC

## 2014-09-12 MED ORDER — IPRATROPIUM BROMIDE 0.02 % IN SOLN
0.5000 mg | Freq: Once | RESPIRATORY_TRACT | Status: AC
  Administered 2014-09-12: 0.5 mg via RESPIRATORY_TRACT

## 2014-09-12 NOTE — Patient Instructions (Signed)

## 2014-09-12 NOTE — Progress Notes (Signed)
Subjective:  Patient ID: Bradley Flores, male    DOB: 07-01-1970  Age: 44 y.o. MRN: [id number]  CC: Cough; Chest Pain; and Sinusitis   HPI Bradley Flores presents  with a two-week history of cough and nasal discharge. He has nasal discharge and postnasal drainage. He has nasal congestion and pressure and pain in his cheeks and between his eyes. He has a cough rectum. Sputum with no wheezing or shortness of breath. No fever or chills. He has no nausea vomiting. No rash. Said no improvement with over-the-counter medication.  History Bradley Flores has a past medical history of Asthma and Allergic rhinitis.   He has past surgical history that includes Fracture surgery.   His  family history includes Diabetes in his father, sister, and sister; Hypertension in his mother.  He   reports that he has never smoked. He has never used smokeless tobacco. He reports that he does not drink alcohol or use illicit drugs.  Outpatient Prescriptions Prior to Visit  Medication Sig Dispense Refill  . loratadine (CLARITIN) 10 MG tablet Take 10 mg by mouth daily.    . Olopatadine HCl (PATADAY) 0.2 % SOLN 1 gtt daily (Patient not taking: Reported on 09/12/2014) 1 Bottle 6  . albuterol (PROVENTIL HFA;VENTOLIN HFA) 108 (90 BASE) MCG/ACT inhaler Inhale 2 puffs into the lungs every 4 (four) hours as needed for wheezing. 1 Inhaler 1  . HYDROcodone-acetaminophen (NORCO/VICODIN) 5-325 MG per tablet Take 1 tablet by mouth every 6 (six) hours as needed for pain. 30 tablet 0  . predniSONE (DELTASONE) 20 MG tablet Take 3 tablets for 2 days, then 2 tablets for 3 days, ten 1 tablet for 3 days 15 tablet 0   No facility-administered medications prior to visit.    History   Social History  . Marital Status: Married    Spouse Name: N/A  . Number of Children: N/A  . Years of Education: N/A   Social History Main Topics  . Smoking status: Never Smoker   . Smokeless tobacco: Never Used  . Alcohol Use: No  . Drug Use: No  .  Sexual Activity: Not on file   Other Topics Concern  . None   Social History Narrative     Review of Systems  Constitutional: Negative for fever, chills and appetite change.  HENT: Negative for congestion, ear pain, postnasal drip, sinus pressure and sore throat.   Eyes: Negative for pain and redness.  Respiratory: Negative for cough, shortness of breath and wheezing.   Cardiovascular: Negative for leg swelling.  Gastrointestinal: Negative for nausea, vomiting, abdominal pain, diarrhea, constipation and blood in stool.  Endocrine: Negative for polyuria.  Genitourinary: Negative for dysuria, urgency, frequency and flank pain.  Musculoskeletal: Negative for gait problem.  Skin: Negative for rash.  Neurological: Negative for weakness and headaches.  Psychiatric/Behavioral: Negative for confusion and decreased concentration. The patient is not nervous/anxious.     Objective:  BP 124/88 mmHg  Pulse 64  Temp(Src) 98 F (36.7 C) (Oral)  Resp 15  Ht  (1.905 m)  Wt 225 lb (102.059 kg)  BMI 28.12 kg/m2  SpO2 97%  Physical Exam  Constitutional: He is oriented to person, place, and time. He appears well-developed and well-nourished. No distress.  HENT:  Head: Normocephalic and atraumatic.  Right Ear: External ear normal.  Left Ear: External ear normal.  Nose: Nose normal.  Eyes: Conjunctivae and EOM are normal. Pupils are equal, round, and reactive to light. No scleral icterus.  Neck: Normal range  of motion. Neck supple. No tracheal deviation present.  Cardiovascular: Normal rate, regular rhythm and normal heart sounds.   Pulmonary/Chest: Effort normal. No respiratory distress. He has no wheezes. He has rales.  Abdominal: He exhibits no mass. There is no tenderness. There is no rebound and no guarding.  Musculoskeletal: He exhibits no edema.  Lymphadenopathy:    He has no cervical adenopathy.  Neurological: He is alert and oriented to person, place, and time. Coordination  normal.  Skin: Skin is warm and dry. No rash noted.  Psychiatric: He has a normal mood and affect. His behavior is normal.      Assessment & Plan:   Bradley FrederickKhalid was seen today for cough, chest pain and sinusitis.  Diagnoses and all orders for this visit:  Acute pansinusitis, recurrence not specified  Acute bronchitis, unspecified organism  Other orders -     amoxicillin-clavulanate (AUGMENTIN) 875-125 MG per tablet; Take 1 tablet by mouth 2 (two) times daily. -     pseudoephedrine-guaifenesin (MUCINEX D) 60-600 MG per tablet; Take 1 tablet by mouth every 12 (twelve) hours. -     chlorpheniramine-HYDROcodone (TUSSIONEX PENNKINETIC ER) 10-8 MG/5ML SUER; Take 5 mLs by mouth 2 (two) times daily.   I have discontinued Bradley Flores's albuterol, predniSONE, and HYDROcodone-acetaminophen. I am also having him start on amoxicillin-clavulanate, pseudoephedrine-guaifenesin, and chlorpheniramine-HYDROcodone. Additionally, I am having him maintain his loratadine, Olopatadine HCl, and budesonide-formoterol.  Meds ordered this encounter  Medications  . budesonide-formoterol (SYMBICORT) 80-4.5 MCG/ACT inhaler    Sig: Inhale 2 puffs into the lungs 2 (two) times daily.  Marland Kitchen. amoxicillin-clavulanate (AUGMENTIN) 875-125 MG per tablet    Sig: Take 1 tablet by mouth 2 (two) times daily.    Dispense:  20 tablet    Refill:  0  . pseudoephedrine-guaifenesin (MUCINEX D) 60-600 MG per tablet    Sig: Take 1 tablet by mouth every 12 (twelve) hours.    Dispense:  18 tablet    Refill:  0  . chlorpheniramine-HYDROcodone (TUSSIONEX PENNKINETIC ER) 10-8 MG/5ML SUER    Sig: Take 5 mLs by mouth 2 (two) times daily.    Dispense:  60 mL    Refill:  0    Appropriate red flag conditions were discussed with the patient as well as actions that should be taken.  Patient expressed his understanding.  Follow-up: Return if symptoms worsen or fail to improve.  Carmelina DaneAnderson, Yochanan Eddleman S, MD

## 2014-09-12 NOTE — Addendum Note (Signed)
Addended by: Carmelina DaneANDERSON, Neil Errickson S on: 09/12/2014 02:40 PM   Modules accepted: Orders

## 2015-01-16 ENCOUNTER — Encounter: Payer: BLUE CROSS/BLUE SHIELD | Admitting: Family Medicine

## 2015-01-18 ENCOUNTER — Ambulatory Visit (INDEPENDENT_AMBULATORY_CARE_PROVIDER_SITE_OTHER): Payer: BLUE CROSS/BLUE SHIELD | Admitting: Family Medicine

## 2015-01-18 VITALS — BP 115/89 | HR 67 | Temp 98.2°F | Ht 75.0 in | Wt 220.7 lb

## 2015-01-18 DIAGNOSIS — Z0000 Encounter for general adult medical examination without abnormal findings: Secondary | ICD-10-CM | POA: Insufficient documentation

## 2015-01-18 DIAGNOSIS — Z23 Encounter for immunization: Secondary | ICD-10-CM

## 2015-01-18 DIAGNOSIS — R208 Other disturbances of skin sensation: Secondary | ICD-10-CM | POA: Diagnosis not present

## 2015-01-18 LAB — CBC
HEMATOCRIT: 42.7 % (ref 39.0–52.0)
HEMOGLOBIN: 14.7 g/dL (ref 13.0–17.0)
MCH: 29.1 pg (ref 26.0–34.0)
MCHC: 34.4 g/dL (ref 30.0–36.0)
MCV: 84.6 fL (ref 78.0–100.0)
MPV: 11.5 fL (ref 8.6–12.4)
Platelets: 198 10*3/uL (ref 150–400)
RBC: 5.05 MIL/uL (ref 4.22–5.81)
RDW: 13.3 % (ref 11.5–15.5)
WBC: 4.9 10*3/uL (ref 4.0–10.5)

## 2015-01-18 LAB — LIPID PANEL
CHOL/HDL RATIO: 3.6 ratio (ref <=5.0)
CHOLESTEROL: 160 mg/dL (ref 125–200)
HDL: 45 mg/dL (ref >=40)
LDL CALC: 92 mg/dL (ref <130)
TRIGLYCERIDES: 117 mg/dL (ref <150)
VLDL: 23 mg/dL (ref <30)

## 2015-01-18 LAB — COMPREHENSIVE METABOLIC PANEL
ALK PHOS: 80 U/L (ref 40–115)
ALT: 17 U/L (ref 9–46)
AST: 20 U/L (ref 10–40)
Albumin: 4.6 g/dL (ref 3.6–5.1)
BUN: 14 mg/dL (ref 7–25)
CALCIUM: 9.2 mg/dL (ref 8.6–10.3)
CHLORIDE: 101 mmol/L (ref 98–110)
CO2: 26 mmol/L (ref 20–31)
Creat: 0.98 mg/dL (ref 0.60–1.35)
GLUCOSE: 92 mg/dL (ref 65–99)
POTASSIUM: 4.4 mmol/L (ref 3.5–5.3)
Sodium: 139 mmol/L (ref 135–146)
Total Bilirubin: 0.5 mg/dL (ref 0.2–1.2)
Total Protein: 7.4 g/dL (ref 6.1–8.1)

## 2015-01-18 LAB — POCT UA - MICROSCOPIC ONLY

## 2015-01-18 LAB — POCT URINALYSIS DIPSTICK
BILIRUBIN UA: NEGATIVE
Glucose, UA: NEGATIVE
Ketones, UA: NEGATIVE
Leukocytes, UA: NEGATIVE
NITRITE UA: NEGATIVE
PH UA: 6
Protein, UA: NEGATIVE
SPEC GRAV UA: 1.025
UROBILINOGEN UA: 0.2

## 2015-01-18 LAB — HIV ANTIBODY (ROUTINE TESTING W REFLEX): HIV: NONREACTIVE

## 2015-01-18 NOTE — Assessment & Plan Note (Signed)
Patient reports intermittent burning sensation of his feet. No current symptoms. Exam unremarkable. Monofilament testing negative. -Suspect likely due to patient standing on feet throughout the workday -Will check fasting BMP to monitor for diabetes

## 2015-01-18 NOTE — Progress Notes (Signed)
   Subjective:    Patient ID: Bradley Flores, male    DOB: 11/30/1970, 44 y.o.   MRN: [id number]  HPI 44 year old African-American male presents for annual preventative visit and physical exam.  Acute concerns: Patient reports occasional "burning" in bilateral feet, worse at the end of the day after working, denies swelling in his legs, does wear heavy boots throughout the day which irritate his feet, no current numbness or tingling, no rash  Diet: Patient is from sedan, enjoys [place] dishes which him and his wife make at home, rarely eats fast food, does drink 1-2 sodas per day  Exercise: Patient plays soccer twice per week  Social: Patient is wife, 2 kids, denies tobacco use, denies alcohol use, works for [employer]  Cancer screening: Colon - no family history of colon cancer, no changes in BM Prostate-no family history of prostate cancer, no nocturia, no hesitancy, DRE and PSA normal last year per patient   Review of Systems  Constitutional: Negative for fever, chills and fatigue.  Respiratory: Negative for cough and shortness of breath.   Cardiovascular: Negative for chest pain and leg swelling.  Gastrointestinal: Negative for nausea, vomiting and diarrhea.       Objective:   Physical Exam Vitals: Reviewed Gen.: Pleasant African-American male, no acute distress HEENT: Normocephalic, pupils equal round and reactive to light, extraocular movements are intact, no scleral icterus, nasal septum midline, boggy nasal turbinates, moist mucous membranes, uvula midline, no pharyngeal or erythema exudate noted, neck was supple, no anterior posterior cervical lymphadenopathy, no thyromegaly Cardiac: Regular rate and rhythm, S1 and S2 present, no murmurs, no heaves or thrills Respiratory: Clear to discussion bilaterally, normal effort Abdomen: Soft, nontender, bowel sounds present Skin: No rash Extremities: No edema, 2+ radial and dorsalis pedis pulses bilaterally, no skin  breakdown on the feet, monofilament examination normal to bilateral feet      Assessment & Plan:  Preventative health care 44 year old male presents for preventative care/annual physical. -Not a candidate for colon cancer screening -No current urinary symptoms therefore will defer PSA and DRE -Routine lab work obtained including lipid profile, CMP, CBC, and HIV -Patient provided flu vaccine and Tdap -Living will/POA information provided to the patient -Encouraged patient to continue to exercise regularly and to eat a balanced diet  Burning sensation of feet Patient reports intermittent burning sensation of his feet. No current symptoms. Exam unremarkable. Monofilament testing negative. -Suspect likely due to patient standing on feet throughout the workday -Will check fasting BMP to monitor for diabetes

## 2015-01-18 NOTE — Patient Instructions (Signed)
It was nice to meet you today.  We will check labs today. Dr. Randolm IdolFletke will call you with the results.

## 2015-01-18 NOTE — Assessment & Plan Note (Signed)
44 year old male presents for preventative care/annual physical. -Not a candidate for colon cancer screening -No current urinary symptoms therefore will defer PSA and DRE -Routine lab work obtained including lipid profile, CMP, CBC, and HIV -Patient provided flu vaccine and Tdap -Living will/POA information provided to the patient -Encouraged patient to continue to exercise regularly and to eat a balanced diet

## 2015-01-19 ENCOUNTER — Encounter: Payer: Self-pay | Admitting: Family Medicine

## 2015-03-07 ENCOUNTER — Ambulatory Visit (INDEPENDENT_AMBULATORY_CARE_PROVIDER_SITE_OTHER): Payer: BLUE CROSS/BLUE SHIELD

## 2015-03-07 ENCOUNTER — Ambulatory Visit (INDEPENDENT_AMBULATORY_CARE_PROVIDER_SITE_OTHER): Payer: BLUE CROSS/BLUE SHIELD | Admitting: Family Medicine

## 2015-03-07 VITALS — BP 128/80 | HR 74 | Temp 98.0°F | Resp 16 | Ht 75.0 in | Wt 231.0 lb

## 2015-03-07 DIAGNOSIS — J988 Other specified respiratory disorders: Secondary | ICD-10-CM | POA: Diagnosis not present

## 2015-03-07 DIAGNOSIS — R52 Pain, unspecified: Secondary | ICD-10-CM | POA: Diagnosis not present

## 2015-03-07 DIAGNOSIS — R062 Wheezing: Secondary | ICD-10-CM

## 2015-03-07 DIAGNOSIS — R059 Cough, unspecified: Secondary | ICD-10-CM

## 2015-03-07 DIAGNOSIS — R05 Cough: Secondary | ICD-10-CM

## 2015-03-07 DIAGNOSIS — R0981 Nasal congestion: Secondary | ICD-10-CM

## 2015-03-07 DIAGNOSIS — J22 Unspecified acute lower respiratory infection: Secondary | ICD-10-CM

## 2015-03-07 MED ORDER — ALBUTEROL SULFATE HFA 108 (90 BASE) MCG/ACT IN AERS: 1.0000 | INHALATION_SPRAY | RESPIRATORY_TRACT | Status: DC | PRN

## 2015-03-07 MED ORDER — AZITHROMYCIN 250 MG PO TABS: ORAL_TABLET | ORAL | Status: DC

## 2015-03-07 NOTE — Patient Instructions (Signed)
Start azithromycin for possible bronchitis or lung infection. mucinex if needed for cough, but if you are wheezing or short of breath - use albuterol inhaler.  If you have to use inhaler more than 3 times per day, or continuing to need inhaler frequently in next 3 days - return for recheck. Return to the clinic or go to the nearest emergency room if any of your symptoms worsen or new symptoms occur.   Acute Bronchitis Bronchitis is inflammation of the airways that extend from the windpipe into the lungs (bronchi). The inflammation often causes mucus to develop. This leads to a cough, which is the most common symptom of bronchitis.  In acute bronchitis, the condition usually develops suddenly and goes away over time, usually in a couple weeks. Smoking, allergies, and asthma can make bronchitis worse. Repeated episodes of bronchitis may cause further lung problems.  CAUSES Acute bronchitis is most often caused by the same virus that causes a cold. The virus can spread from person to person (contagious) through coughing, sneezing, and touching contaminated objects. SIGNS AND SYMPTOMS   Cough.   Fever.   Coughing up mucus.   Body aches.   Chest congestion.   Chills.   Shortness of breath.   Sore throat.  DIAGNOSIS  Acute bronchitis is usually diagnosed through a physical exam. Your health care provider will also ask you questions about your medical history. Tests, such as chest X-rays, are sometimes done to rule out other conditions.  TREATMENT  Acute bronchitis usually goes away in a couple weeks. Oftentimes, no medical treatment is necessary. Medicines are sometimes given for relief of fever or cough. Antibiotic medicines are usually not needed but may be prescribed in certain situations. In some cases, an inhaler may be recommended to help reduce shortness of breath and control the cough. A cool mist vaporizer may also be used to help thin bronchial secretions and make it easier  to clear the chest.  HOME CARE INSTRUCTIONS  Get plenty of rest.   Drink enough fluids to keep your urine clear or pale yellow (unless you have a medical condition that requires fluid restriction). Increasing fluids may help thin your respiratory secretions (sputum) and reduce chest congestion, and it will prevent dehydration.   Take medicines only as directed by your health care provider.  If you were prescribed an antibiotic medicine, finish it all even if you start to feel better.  Avoid smoking and secondhand smoke. Exposure to cigarette smoke or irritating chemicals will make bronchitis worse. If you are a smoker, consider using nicotine gum or skin patches to help control withdrawal symptoms. Quitting smoking will help your lungs heal faster.   Reduce the chances of another bout of acute bronchitis by washing your hands frequently, avoiding people with cold symptoms, and trying not to touch your hands to your mouth, nose, or eyes.   Keep all follow-up visits as directed by your health care provider.  SEEK MEDICAL CARE IF: Your symptoms do not improve after 1 week of treatment.  SEEK IMMEDIATE MEDICAL CARE IF:  You develop an increased fever or chills.   You have chest pain.   You have severe shortness of breath.  You have bloody sputum.   You develop dehydration.  You faint or repeatedly feel like you are going to pass out.  You develop repeated vomiting.  You develop a severe headache. MAKE SURE YOU:   Understand these instructions.  Will watch your condition.  Will get help right away if  you are not doing well or get worse.   This information is not intended to replace advice given to you by your health care provider. Make sure you discuss any questions you have with your health care provider.   Document Released: 03/27/2004 Document Revised: 03/10/2014 Document Reviewed: 08/10/2012 Elsevier Interactive Patient Education 2016 Else[employer]  Inc[employer]  Bronchospasm, Adult A bronchospasm is when the tubes that carry air in and out of your lungs (airways) spasm or tighten. During a bronchospasm it is hard to breathe. This is because the airways get smaller. A bronchospasm can be triggered by:  Allergies. These may be to animals, pollen, food, or mold.  Infection. This is a common cause of bronchospasm.  Exercise.  Irritants. These include pollution, cigarette smoke, strong odors, aerosol sprays, and paint fumes.  Weather changes.  Stress.  Being emotional. HOME CARE   Always have a plan for getting help. Know when to call your doctor and local emergency services (911 in the [place] Know where you can get emergency care.  Only take medicines as told by your doctor.  If you were prescribed an inhaler or nebulizer machine, ask your doctor how to use it correctly. Always use a spacer with your inhaler if you were given one.  Stay calm during an attack. Try to relax and breathe more slowly.  Control your home environment:  Change your heating and air conditioning filter at least once a month.  Limit your use of fireplaces and wood stoves.  Do not  smoke. Do not  allow smoking in your home.  Avoid perfumes and fragrances.  Get rid of pests (such as roaches and mice) and their droppings.  Throw away plants if you see mold on them.  Keep your house clean and dust free.  Replace carpet with wood, tile, or vinyl flooring. Carpet can trap dander and dust.  Use allergy-proof pillows, mattress covers, and box spring covers.  Wash bed sheets and blankets every week in hot water. Dry them in a dryer.  Use blankets that are made of polyester or cotton.  Wash hands frequently. GET HELP IF:  You have muscle aches.  You have chest pain.  The thick spit you spit or cough up (sputum) changes from clear or white to yellow, green, gray, or bloody.  The thick spit you spit or cough up gets thicker.  There are problems that  may be related to the medicine you are given such as:  A rash.  Itching.  Swelling.  Trouble breathing. GET HELP RIGHT AWAY IF:  You feel you cannot breathe or catch your breath.  You cannot stop coughing.  Your treatment is not helping you breathe better.  You have very bad chest pain. MAKE SURE YOU:   Understand these instructions.  Will watch your condition.  Will get help right away if you are not doing well or get worse.   This information is not intended to replace advice given to you by your health care provider. Make sure you discuss any questions you have with your health care provider.   Document Released: 12/15/2008 Document Revised: 03/10/2014 Document Reviewed: 08/10/2012 Elsevier Interactive Patient Education [employer]

## 2015-03-07 NOTE — Progress Notes (Signed)
Subjective:  By signing my name below, I, Raven Small, attest that this documentation has been prepared under the direction and in the presence of Meredith Staggers, MD.  Electronically Signed: Andrew Au, ED Scribe. 03/07/2015. 12:03 PM.   Patient ID: Bradley Flores, male    DOB: 10/22/1970, 45 y.o.   MRN: [id number]  HPI Chief Complaint  Patient presents with  . Cough    x 3 days, productive  . Headache    x 3 days  . Sinusitis    green mucus, x 3 days  . Generalized Body Aches    x 3 days   HPI Comments: Bradley Flores is a 45 y.o. male who presents to the [facility] complaining of cough for the 3 days. Symptoms started with subjective fever, chills, rhinorrhea and HA 3 days ago followed by a productive cough with green sputum 1 day later. He's also has chest soreness with cough, wheezing and joint pain. He has been treated symptoms with tylenol. He has an Symbicort but has not been using it. He had sick contacts at home including his children. Pt received flu shot this year.  Patient Active Problem List   Diagnosis Date Noted  . Allergic rhinitis    Past Medical History  Diagnosis Date  . Asthma   . Allergic rhinitis   . Allergy    Past Surgical History  Procedure Laterality Date  . Fracture surgery     No Known Allergies Prior to Admission medications   Medication Sig Start Date End Date Taking? Authorizing Provider  albuterol (PROVENTIL HFA;VENTOLIN HFA) 108 (90 BASE) MCG/ACT inhaler Inhale 2 puffs into the lungs every 4 (four) hours as needed for wheezing or shortness of breath (cough, shortness of breath or wheezing.). 09/12/14  Yes Carmelina Dane, MD  budesonide-formoterol (SYMBICORT) 80-4.5 MCG/ACT inhaler Inhale 2 puffs into the lungs 2 (two) times daily.   Yes Historical Provider, MD  chlorpheniramine-HYDROcodone (TUSSIONEX PENNKINETIC ER) 10-8 MG/5ML SUER  Take 5 mLs by mouth 2 (two) times daily. 09/12/14  Yes Carmelina Dane, MD    amoxicillin-clavulanate (AUGMENTIN) 875-125 MG per tablet Take 1 tablet by mouth 2 (two) times daily. Patient not taking: Reported on 03/07/2015 09/12/14   Carmelina Dane, MD  loratadine (CLARITIN) 10 MG tablet Take 10 mg by mouth daily. Reported on 03/07/2015    Historical Provider, MD  Olopatadine HCl (PATADAY) 0.2 % SOLN 1 gtt daily Patient not taking: Reported on 09/12/2014 06/14/12   Carmelina Dane, MD  pseudoephedrine-guaifenesin [facility] D) 60-600 MG per tablet Take 1 tablet by mouth every 12 (twelve) hours. Patient not taking: Reported on 03/07/2015 09/12/14 09/12/15  Carmelina Dane, MD   Social History   Social History  . Marital Status: Married    Spouse Name: N/A  . Number of Children: N/A  . Years of Education: N/A   Occupational History  . Not on file.   Social History Main Topics  . Smoking status: Never Smoker   . Smokeless tobacco: Never Used  . Alcohol Use: No  . Drug Use: No  . Sexual Activity: Not on file   Other Topics Concern  . Not on file   Social History Narrative   Review of Systems  Constitutional: Positive for fever and chills.  HENT: Positive for congestion and rhinorrhea.   Respiratory: Positive for cough and wheezing.   Gastrointestinal: Negative for nausea, vomiting and abdominal pain.  Musculoskeletal: Positive for myalgias and arthralgias.  Neurological:  Positive for headaches.    Objective:   Physical Exam  Constitutional: He is oriented to person, place, and time. He appears well-developed and well-nourished. No distress.  HENT:  Head: Normocephalic and atraumatic.  Nose: Nose normal. Right sinus exhibits no maxillary sinus tenderness and no frontal sinus tenderness. Left sinus exhibits no maxillary sinus tenderness and no frontal sinus tenderness.  Mouth/Throat: Oropharynx is clear and moist. No oropharyngeal exudate or posterior oropharyngeal erythema.  Edema of turbinates bilaterally.  Eyes: Conjunctivae and EOM are normal.   Neck: Neck supple.  Cardiovascular: Normal rate, regular rhythm and normal heart sounds.   No murmur heard. Pulmonary/Chest: Effort normal and breath sounds normal. He has no wheezes. He has no rales.  Possible faint coarse breath sound lower lobe bilaterally.   Musculoskeletal: Normal range of motion.  Neurological: He is alert and oriented to person, place, and time.  Skin: Skin is warm and dry.  Psychiatric: He has a normal mood and affect. His behavior is normal.  Nursing note and vitals reviewed.  Filed Vitals:   03/07/15 1124  BP: 128/80  Pulse: 74  Temp: 98 F (36.7 C)  TempSrc: Oral  Resp: 16  Height: 6\' 3"  (1.905 m)  Weight: 231 lb (104.781 kg)  SpO2: 98%   XRAY [facility] reading (PRIMARY) by Dr. Neva SeatGreene. CXR: few increased bronchitic markings slightly more prominent on right than left.     Assessment & Plan:   Bradley PoliceKhalid Flores is a 45 y.o. male Cough - Plan: DG Chest 2 View  Body aches - Plan: DG Chest 2 View  Nasal congestion  LRTI (lower respiratory tract infection) - Plan: azithromycin (ZITHROMAX) 250 MG tablet  Wheezing - Plan: albuterol (PROVENTIL HFA;VENTOLIN HFA) 108 (90 Base) MCG/ACT inhaler  Possible viral illness, but increased cough, discolored sputum, increased markings on CXR - possible early bronchitis or less likely CAP. Intermittent wheezing with history of reactive airway.  -Zpak #1  -albuterol inhaler discussed and RTC precautions if frequent/persistent use.   - mucinex for cough.  Would stop this if increasing wheeze.   -sx care, RTC precautions discussed and understanding expressed.   Meds ordered this encounter  Medications  . azithromycin (ZITHROMAX) 250 MG tablet    Sig: Take 2 pills by mouth on day 1, then 1 pill by mouth per day on days 2 through 5.    Dispense:  6 tablet    Refill:  0  . albuterol (PROVENTIL HFA;VENTOLIN HFA) 108 (90 Base) MCG/ACT inhaler    Sig: Inhale 1-2 puffs into the lungs every 4 (four) hours as needed (cough,  shortness of breath or wheezing.).    Dispense:  1 Inhaler    Refill:  0   Patient Instructions  Start azithromycin for possible bronchitis or lung infection. mucinex if needed for cough, but if you are wheezing or short of breath - use albuterol inhaler.  If you have to use inhaler more than 3 times per day, or continuing to need inhaler frequently in next 3 days - return for recheck. Return to the clinic or go to the nearest emergency room if any of your symptoms worsen or new symptoms occur.   Acute Bronchitis Bronchitis is inflammation of the airways that extend from the windpipe into the lungs (bronchi). The inflammation often causes mucus to develop. This leads to a cough, which is the most common symptom of bronchitis.  In acute bronchitis, the condition usually develops suddenly and goes away over time, usually in a couple weeks.  Smoking, allergies, and asthma can make bronchitis worse. Repeated episodes of bronchitis may cause further lung problems.  CAUSES Acute bronchitis is most often caused by the same virus that causes a cold. The virus can spread from person to person (contagious) through coughing, sneezing, and touching contaminated objects. SIGNS AND SYMPTOMS   Cough.   Fever.   Coughing up mucus.   Body aches.   Chest congestion.   Chills.   Shortness of breath.   Sore throat.  DIAGNOSIS  Acute bronchitis is usually diagnosed through a physical exam. Your health care provider will also ask you questions about your medical history. Tests, such as chest X-rays, are sometimes done to rule out other conditions.  TREATMENT  Acute bronchitis usually goes away in a couple weeks. Oftentimes, no medical treatment is necessary. Medicines are sometimes given for relief of fever or cough. Antibiotic medicines are usually not needed but may be prescribed in certain situations. In some cases, an inhaler may be recommended to help reduce shortness of breath and control  the cough. A cool mist vaporizer may also be used to help thin bronchial secretions and make it easier to clear the chest.  HOME CARE INSTRUCTIONS  Get plenty of rest.   Drink enough fluids to keep your urine clear or pale yellow (unless you have a medical condition that requires fluid restriction). Increasing fluids may help thin your respiratory secretions (sputum) and reduce chest congestion, and it will prevent dehydration.   Take medicines only as directed by your health care provider.  If you were prescribed an antibiotic medicine, finish it all even if you start to feel better.  Avoid smoking and secondhand smoke. Exposure to cigarette smoke or irritating chemicals will make bronchitis worse. If you are a smoker, consider using nicotine gum or skin patches to help control withdrawal symptoms. Quitting smoking will help your lungs heal faster.   Reduce the chances of another bout of acute bronchitis by washing your hands frequently, avoiding people with cold symptoms, and trying not to touch your hands to your mouth, nose, or eyes.   Keep all follow-up visits as directed by your health care provider.  SEEK MEDICAL CARE IF: Your symptoms do not improve after 1 week of treatment.  SEEK IMMEDIATE MEDICAL CARE IF:  You develop an increased fever or chills.   You have chest pain.   You have severe shortness of breath.  You have bloody sputum.   You develop dehydration.  You faint or repeatedly feel like you are going to pass out.  You develop repeated vomiting.  You develop a severe headache. MAKE SURE YOU:   Understand these instructions.  Will watch your condition.  Will get help right away if you are not doing well or get worse.   This information is not intended to replace advice given to you by your health care provider. Make sure you discuss any questions you have with your health care provider.   Document Released: 03/27/2004 Document Revised: 03/10/2014  Document Reviewed: 08/10/2012 Elsevier Interactive Patient Education 2016 Else[employer] Inc[employer]  Bronchospasm, Adult A bronchospasm is when the tubes that carry air in and out of your lungs (airways) spasm or tighten. During a bronchospasm it is hard to breathe. This is because the airways get smaller. A bronchospasm can be triggered by:  Allergies. These may be to animals, pollen, food, or mold.  Infection. This is a common cause of bronchospasm.  Exercise.  Irritants. These include pollution, cigarette smoke, strong odors,  aerosol sprays, and paint fumes.  Weather changes.  Stress.  Being emotional. HOME CARE   Always have a plan for getting help. Know when to call your doctor and local emergency services (911 in the [place] Know where you can get emergency care.  Only take medicines as told by your doctor.  If you were prescribed an inhaler or nebulizer machine, ask your doctor how to use it correctly. Always use a spacer with your inhaler if you were given one.  Stay calm during an attack. Try to relax and breathe more slowly.  Control your home environment:  Change your heating and air conditioning filter at least once a month.  Limit your use of fireplaces and wood stoves.  Do not  smoke. Do not  allow smoking in your home.  Avoid perfumes and fragrances.  Get rid of pests (such as roaches and mice) and their droppings.  Throw away plants if you see mold on them.  Keep your house clean and dust free.  Replace carpet with wood, tile, or vinyl flooring. Carpet can trap dander and dust.  Use allergy-proof pillows, mattress covers, and box spring covers.  Wash bed sheets and blankets every week in hot water. Dry them in a dryer.  Use blankets that are made of polyester or cotton.  Wash hands frequently. GET HELP IF:  You have muscle aches.  You have chest pain.  The thick spit you spit or cough up (sputum) changes from clear or white to yellow, green, gray, or  bloody.  The thick spit you spit or cough up gets thicker.  There are problems that may be related to the medicine you are given such as:  A rash.  Itching.  Swelling.  Trouble breathing. GET HELP RIGHT AWAY IF:  You feel you cannot breathe or catch your breath.  You cannot stop coughing.  Your treatment is not helping you breathe better.  You have very bad chest pain. MAKE SURE YOU:   Understand these instructions.  Will watch your condition.  Will get help right away if you are not doing well or get worse.   This information is not intended to replace advice given to you by your health care provider. Make sure you discuss any questions you have with your health care provider.   Document Released: 12/15/2008 Document Revised: 03/10/2014 Document Reviewed: 08/10/2012 Elsevier Interactive Patient Education [employer]     I personally performed the services described in this documentation, which was scribed in my presence. The recorded information has been reviewed and considered, and addended by me as needed.

## 2016-02-28 ENCOUNTER — Ambulatory Visit (INDEPENDENT_AMBULATORY_CARE_PROVIDER_SITE_OTHER): Payer: BLUE CROSS/BLUE SHIELD | Admitting: Family Medicine

## 2016-02-28 ENCOUNTER — Encounter: Payer: Self-pay | Admitting: Family Medicine

## 2016-02-28 VITALS — BP 116/78 | HR 65 | Temp 98.0°F | Ht 75.0 in | Wt 223.2 lb

## 2016-02-28 DIAGNOSIS — Z0000 Encounter for general adult medical examination without abnormal findings: Secondary | ICD-10-CM

## 2016-02-28 DIAGNOSIS — Z23 Encounter for immunization: Secondary | ICD-10-CM

## 2016-02-28 LAB — CBC
HEMATOCRIT: 46.1 % (ref 38.5–50.0)
HEMOGLOBIN: 15.5 g/dL (ref 13.2–17.1)
MCH: 29.4 pg (ref 27.0–33.0)
MCHC: 33.6 g/dL (ref 32.0–36.0)
MCV: 87.3 fL (ref 80.0–100.0)
MPV: 11.6 fL (ref 7.5–12.5)
Platelets: 196 10*3/uL (ref 140–400)
RBC: 5.28 MIL/uL (ref 4.20–5.80)
RDW: 13.5 % (ref 11.0–15.0)
WBC: 4.9 10*3/uL (ref 3.8–10.8)

## 2016-02-28 LAB — BASIC METABOLIC PANEL WITH GFR
BUN: 15 mg/dL (ref 7–25)
CHLORIDE: 103 mmol/L (ref 98–110)
CO2: 28 mmol/L (ref 20–31)
Calcium: 9.1 mg/dL (ref 8.6–10.3)
Creat: 0.95 mg/dL (ref 0.60–1.35)
GLUCOSE: 115 mg/dL — AB (ref 65–99)
POTASSIUM: 4.6 mmol/L (ref 3.5–5.3)
Sodium: 140 mmol/L (ref 135–146)

## 2016-02-28 LAB — LIPID PANEL
Cholesterol: 171 mg/dL (ref <200)
HDL: 47 mg/dL (ref >40)
LDL Cholesterol: 92 mg/dL (ref <100)
Total CHOL/HDL Ratio: 3.6 Ratio (ref <5.0)
Triglycerides: 160 mg/dL — ABNORMAL HIGH (ref <150)
VLDL: 32 mg/dL — ABNORMAL HIGH (ref <30)

## 2016-02-28 NOTE — Progress Notes (Signed)
  Bradley HillierIan McKeag, MD, MS Phone: [phone]  Subjective:  CC -- Annual Physical;  Pt reports he has been feeling well. He has no issues that he would like to discuss this time.  Cardiovascular: - Risk as of 01/18/15 (date): 3.5% (assessment every 3-5 years) - Dx Hypertension: no  - Dx Hyperlipidemia: no  - Dx Obesity: no - Physical Activity: yes, soccer occasionally; work is very active though  - Diabetes: no   Cancer: Colorectal >> Colonoscopy: n/a Lung >> Tobacco Use: no, never   - If so, previous Low-Dose CT screen: n/a  Prostate >> Interested in DRE and/or PSA: no  - occasional nocturia (1x a night irregularly) Skin >> Suspicious lesions: no   Social: Alcohol Use: no  Tobacco Use: no   - Interested in Quitting: n/a  Other Drugs: no  Risky Sexual Behavior: no  Depression: no  Support and Life at Home: yes   Other: Osteoporosis: no  Zoster Vaccine: n/a  Flu Vaccine: yes, today  Pneumonia Vaccine: no   ROS- no recent fevers, chills, headache, blurred vision, vision changes, appetite changes, weight loss/gain, dysphasia, shortness breath, chest pain, lightheadedness, vertigo, confusion, fatigue, nausea, vomiting, diarrhea, weakness, numbness, or paresthesias.  Past Medical History Patient Active Problem List   Diagnosis Date Noted  . Preventative health care 01/18/2015  . Burning sensation of feet 01/18/2015  . Oral mucosal lesion 01/17/2014  . Vision changes 01/17/2014  . Prediabetes 01/17/2014  . Allergic rhinitis     Medications- reviewed and updated Current Outpatient Prescriptions  Medication Sig Dispense Refill  . albuterol (PROVENTIL HFA;VENTOLIN HFA) 108 (90 Base) MCG/ACT inhaler Inhale 1-2 puffs into the lungs every 4 (four) hours as needed (cough, shortness of breath or wheezing.). 1 Inhaler 0  . budesonide-formoterol (SYMBICORT) 80-4.5 MCG/ACT inhaler Inhale 2 puffs into the lungs 2 (two) times daily.    Marland Kitchen. loratadine (CLARITIN) 10 MG tablet Take 10 mg  by mouth daily. Reported on 03/07/2015    . mometasone (NASONEX) 50 MCG/ACT nasal spray Place 2 sprays into the nose daily. 17 g 12  . Olopatadine HCl 0.2 % SOLN Apply 2 drops to eye 3 (three) times daily as needed. 2.5 mL 3   Current Facility-Administered Medications  Medication Dose Route Frequency Provider Last Rate Last Dose  . albuterol (PROVENTIL) (2.5 MG/3ML) 0.083% nebulizer solution 2.5 mg  2.5 mg Nebulization Once Carmelina DaneJeffery S Anderson, MD        Objective: BP 116/78   Pulse 65   Temp 98 F (36.7 C) (Oral)   Ht 6\' 3"  (1.905 m)   Wt 223 lb 3.2 oz (101.2 kg)   BMI 27.90 kg/m  Gen: NAD, alert, cooperative with exam HEENT: NCAT, EOMI, PERRL CV: RRR, good S1/S2, no murmur Resp: CTABL, no wheezes, non-labored Abd: Soft, Non Tender, Non Distended, BS present, no guarding or organomegaly Ext: No edema, warm Neuro: Alert and oriented, No gross deficits   Assessment/Plan:  Preventative health care Patient is here for a annual health physical. He overall feels quite well. No issues to discuss today. Exam was normal today. - Flu vaccine provided today. - Labs obtained today. - Follow-up one year   Orders Placed This Encounter  Procedures  . Flu Vaccine QUAD 36+ mos IM  . Lipid panel  . CBC  . BASIC METABOLIC PANEL WITH GFR     Bradley DeltonIan D McKeag, MD,MS,  PGY3 02/28/2016 12:33 PM

## 2016-02-28 NOTE — Assessment & Plan Note (Signed)
Patient is here for a annual health physical. He overall feels quite well. No issues to discuss today. Exam was normal today. - Flu vaccine provided today. - Labs obtained today. - Follow-up one year

## 2016-02-28 NOTE — Patient Instructions (Signed)
Exercising to Stay Healthy Introduction Exercising regularly is important. It has many health benefits, such as:  Improving your overall fitness, flexibility, and endurance.  Increasing your bone density.  Helping with weight control.  Decreasing your body fat.  Increasing your muscle strength.  Reducing stress and tension.  Improving your overall health. In order to become healthy and stay healthy, it is recommended that you do moderate-intensity and vigorous-intensity exercise. You can tell that you are exercising at a moderate intensity if you have a higher heart rate and faster breathing, but you are still able to hold a conversation. You can tell that you are exercising at a vigorous intensity if you are breathing much harder and faster and cannot hold a conversation while exercising. How often should I exercise? Choose an activity that you enjoy and set realistic goals. Your health care provider can help you to make an activity plan that works for you. Exercise regularly as directed by your health care provider. This may include:  Doing resistance training twice each week, such as:  Push-ups.  Sit-ups.  Lifting weights.  Using resistance bands.  Doing a given intensity of exercise for a given amount of time. Choose from these options:  150 minutes of moderate-intensity exercise every week.  75 minutes of vigorous-intensity exercise every week.  A mix of moderate-intensity and vigorous-intensity exercise every week. Children, pregnant women, people who are out of shape, people who are overweight, and older adults may need to consult a health care provider for individual recommendations. If you have any sort of medical condition, be sure to consult your health care provider before starting a new exercise program. What are some exercise ideas? Some moderate-intensity exercise ideas include:  Walking at a rate of 1 mile in 15  minutes.  Biking.  Hiking.  Golfing.  Dancing. Some vigorous-intensity exercise ideas include:  Walking at a rate of at least 4.5 miles per hour.  Jogging or running at a rate of 5 miles per hour.  Biking at a rate of at least 10 miles per hour.  Lap swimming.  Roller-skating or in-line skating.  Cross-country skiing.  Vigorous competitive sports, such as football, basketball, and soccer.  Jumping rope.  Aerobic dancing. What are some everyday activities that can help me to get exercise?  Yard work, such as:  Pushing a lawn mower.  Raking and bagging leaves.  Washing and waxing your car.  Pushing a stroller.  Shoveling snow.  Gardening.  Washing windows or floors. How can I be more active in my day-to-day activities?  Use the stairs instead of the elevator.  Take a walk during your lunch break.  If you drive, park your car farther away from work or school.  If you take public transportation, get off one stop early and walk the rest of the way.  Make all of your phone calls while standing up and walking around.  Get up, stretch, and walk around every 30 minutes throughout the day. What guidelines should I follow while exercising?  Do not exercise so much that you hurt yourself, feel dizzy, or get very short of breath.  Consult your health care provider before starting a new exercise program.  Wear comfortable clothes and shoes with good support.  Drink plenty of water while you exercise to prevent dehydration or heat stroke. Body water is lost during exercise and must be replaced.  Work out until you breathe faster and your heart beats faster. This information is not intended to replace   advice given to you by your health care provider. Make sure you discuss any questions you have with your health care provider. Document Released: 03/22/2010 Document Revised: 07/26/2015 Document Reviewed: 07/21/2013  2017 Elsevier  

## 2016-02-29 ENCOUNTER — Encounter: Payer: Self-pay | Admitting: Family Medicine

## 2016-08-29 ENCOUNTER — Ambulatory Visit (INDEPENDENT_AMBULATORY_CARE_PROVIDER_SITE_OTHER): Payer: BLUE CROSS/BLUE SHIELD | Admitting: Physician Assistant

## 2016-08-29 ENCOUNTER — Encounter: Payer: Self-pay | Admitting: Physician Assistant

## 2016-08-29 VITALS — BP 124/82 | HR 71 | Temp 98.1°F | Resp 16 | Ht 75.0 in | Wt 218.8 lb

## 2016-08-29 DIAGNOSIS — B9689 Other specified bacterial agents as the cause of diseases classified elsewhere: Secondary | ICD-10-CM

## 2016-08-29 DIAGNOSIS — J019 Acute sinusitis, unspecified: Secondary | ICD-10-CM

## 2016-08-29 MED ORDER — AMOXICILLIN-POT CLAVULANATE 875-125 MG PO TABS: 1.0000 | ORAL_TABLET | Freq: Two times a day (BID) | ORAL | 0 refills | Status: AC

## 2016-08-29 MED ORDER — NAPROXEN 500 MG PO TABS: 500.0000 mg | ORAL_TABLET | Freq: Two times a day (BID) | ORAL | 0 refills | Status: DC

## 2016-08-29 NOTE — Progress Notes (Signed)
    09/01/2016 8:56 AM   DOB: 03/15/1970 / MRN: [id number]  SUBJECTIVE:  Bradley Flores is a 46 y.o. male with a history of seasonal allergy presenting for sinus congestion. Also has a history of of asthma.  Complain of frontal sinus HA and worsening nasal congestion that started about 8 days ago and is worsening with his normal zyrtec, azelastine regimen.  Denies teeth pain. Associates myalgia.   He has No Known Allergies.   He  has a past medical history of Allergic rhinitis; Allergy; and Asthma.    He  reports that he has never smoked. He has never used smokeless tobacco. He reports that he does not drink alcohol or use drugs. He  has no sexual activity history on file. The patient  has a past surgical history that includes Fracture surgery.  His family history includes Diabetes in his father, sister, and sister; Hypertension in his mother.  Review of Systems  Constitutional: Negative for chills, diaphoresis and fever.  Eyes: Negative.   Respiratory: Negative for shortness of breath.   Cardiovascular: Negative for chest pain, orthopnea and leg swelling.  Gastrointestinal: Negative for abdominal pain and nausea.  Genitourinary: Negative for dysuria, flank pain, frequency, hematuria and urgency.  Skin: Negative for rash.  Neurological: Negative for dizziness, sensory change, speech change, focal weakness and headaches.    The problem list and medications were reviewed and updated by myself where necessary and exist elsewhere in the encounter.   OBJECTIVE:  BP 124/82 (BP Location: Right Arm, Patient Position: Sitting, Cuff Size: Large)   Pulse 71   Temp 98.1 F (36.7 C) (Oral)   Resp 16   Ht 6\' 3"  (1.905 m)   Wt 218 lb 12.8 oz (99.2 kg)   SpO2 99%   BMI 27.35 kg/m   Physical Exam  Constitutional: He is oriented to person, place, and time. He appears well-developed and well-nourished. He is active and cooperative. No distress.  Cardiovascular: Normal rate.     Pulmonary/Chest: Effort normal. No tachypnea.  Musculoskeletal: Normal range of motion.  Neurological: He is alert and oriented to person, place, and time.  Skin: Skin is warm and dry. He is not diaphoretic.  Vitals reviewed.     No results found for this or any previous visit (from the past 72 hour(s)).  No results found.  ASSESSMENT AND PLAN:  Bradley Flores was seen today for sinusitis.  Diagnoses and all orders for this visit:  Acute bacterial rhinosinusitis:  -     amoxicillin-clavulanate (AUGMENTIN) 875-125 MG tablet; Take 1 tablet by mouth 2 (two) times daily. Take with food. -     naproxen (NAPROSYN) 500 MG tablet; Take 1 tablet (500 mg total) by mouth 2 (two) times daily with a meal.    The patient is advised to call or return to clinic if he does not see an improvement in symptoms, or to seek the care of the closest emergency department if he worsens with the above plan.   Deliah BostonMichael Daron Breeding, MHS, PA-C [facility] 09/01/2016 8:56 AM

## 2016-08-29 NOTE — Patient Instructions (Signed)
     IF you received an x-ray today, you will receive an invoice from [facility]. Please contact [facility] at [phone] with questions or concerns regarding your invoice.   IF you received labwork today, you will receive an invoice from [facility]. Please contact [facility] at [phone] with questions or concerns regarding your invoice.   Our billing staff will not be able to assist you with questions regarding bills from these companies.  You will be contacted with the lab results as soon as they are available. The fastest way to get your results is to activate your My Chart account. Instructions are located on the last page of this paperwork. If you have not heard from us regarding the results in 2 weeks, please contact this office.     

## 2016-12-31 ENCOUNTER — Emergency Department (HOSPITAL_COMMUNITY)
Admission: EM | Admit: 2016-12-31 | Discharge: 2017-01-01 | Disposition: A | Discharge status: Home / Self Care | Payer: BLUE CROSS/BLUE SHIELD | Attending: Emergency Medicine | Admitting: Emergency Medicine

## 2016-12-31 ENCOUNTER — Encounter (HOSPITAL_COMMUNITY): Payer: Self-pay

## 2016-12-31 DIAGNOSIS — M545 Low back pain, unspecified: Secondary | ICD-10-CM

## 2016-12-31 DIAGNOSIS — Y92410 Unspecified street and highway as the place of occurrence of the external cause: Secondary | ICD-10-CM | POA: Diagnosis not present

## 2016-12-31 DIAGNOSIS — M546 Pain in thoracic spine: Secondary | ICD-10-CM | POA: Insufficient documentation

## 2016-12-31 DIAGNOSIS — M542 Cervicalgia: Secondary | ICD-10-CM | POA: Insufficient documentation

## 2016-12-31 DIAGNOSIS — Y999 Unspecified external cause status: Secondary | ICD-10-CM | POA: Insufficient documentation

## 2016-12-31 DIAGNOSIS — R52 Pain, unspecified: Secondary | ICD-10-CM

## 2016-12-31 DIAGNOSIS — Y9389 Activity, other specified: Secondary | ICD-10-CM | POA: Diagnosis not present

## 2016-12-31 DIAGNOSIS — J45909 Unspecified asthma, uncomplicated: Secondary | ICD-10-CM | POA: Insufficient documentation

## 2016-12-31 LAB — URINALYSIS, ROUTINE W REFLEX MICROSCOPIC
Bilirubin Urine: NEGATIVE
GLUCOSE, UA: NEGATIVE mg/dL
Hgb urine dipstick: NEGATIVE
Ketones, ur: NEGATIVE mg/dL
LEUKOCYTES UA: NEGATIVE
NITRITE: NEGATIVE
PH: 6 (ref 5.0–8.0)
Protein, ur: NEGATIVE mg/dL
SPECIFIC GRAVITY, URINE: 1.02 (ref 1.005–1.030)
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
## 2017-01-01 ENCOUNTER — Emergency Department (HOSPITAL_COMMUNITY): Payer: BLUE CROSS/BLUE SHIELD

## 2017-01-01 MED ORDER — IBUPROFEN 200 MG PO TABS
600.0000 mg | ORAL_TABLET | Freq: Once | ORAL | Status: AC
  Administered 2017-01-01: 600 mg via ORAL
  Filled 2017-01-01: qty 3

## 2017-01-01 MED ORDER — CYCLOBENZAPRINE HCL 10 MG PO TABS
10.0000 mg | ORAL_TABLET | Freq: Once | ORAL | Status: AC
  Administered 2017-01-01: 10 mg via ORAL
  Filled 2017-01-01: qty 1

## 2017-01-01 MED ORDER — CYCLOBENZAPRINE HCL 5 MG PO TABS: 5.0000 mg | ORAL_TABLET | Freq: Three times a day (TID) | ORAL | 0 refills | Status: DC | PRN

## 2017-01-01 MED ORDER — NAPROXEN 500 MG PO TABS: ORAL_TABLET | ORAL | 0 refills | Status: DC
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
## 2017-01-01 NOTE — Discharge Instructions (Signed)
Ice packs to the injured or sore muscles for the next several days then start using heat. Take the medications for pain and muscle spasms. Return to the ED for any problems listed on the head injury sheet. Recheck if you aren't improving in the next week. ° °

## 2017-01-21 ENCOUNTER — Ambulatory Visit (INDEPENDENT_AMBULATORY_CARE_PROVIDER_SITE_OTHER): Payer: BLUE CROSS/BLUE SHIELD | Admitting: Family Medicine

## 2017-01-21 ENCOUNTER — Encounter: Payer: Self-pay | Admitting: Family Medicine

## 2017-01-21 VITALS — BP 122/76 | HR 61 | Temp 97.7°F | Resp 16 | Ht 75.0 in | Wt 220.0 lb

## 2017-01-21 DIAGNOSIS — Z0000 Encounter for general adult medical examination without abnormal findings: Secondary | ICD-10-CM

## 2017-01-21 LAB — POCT URINALYSIS DIP (DEVICE)
Bilirubin Urine: NEGATIVE
Glucose, UA: NEGATIVE mg/dL
Leukocytes, UA: NEGATIVE
Nitrite: NEGATIVE
PH: 5.5 (ref 5.0–8.0)
PROTEIN: NEGATIVE mg/dL
UROBILINOGEN UA: 0.2 mg/dL (ref 0.0–1.0)

## 2017-01-21 LAB — POCT GLYCOSYLATED HEMOGLOBIN (HGB A1C): HEMOGLOBIN A1C: 5.9

## 2017-01-21 NOTE — Patient Instructions (Addendum)
Health Maintenance, Male A healthy lifestyle and preventive care is important for your health and wellness. Ask your health care provider about what schedule of regular examinations is right for you. What should I know about weight and diet? Eat a Healthy Diet  Eat plenty of vegetables, fruits, whole grains, low-fat dairy products, and lean protein.  Do not eat a lot of foods high in solid fats, added sugars, or salt.  Maintain a Healthy Weight Regular exercise can help you achieve or maintain a healthy weight. You should:  Do at least 150 minutes of exercise each week. The exercise should increase your heart rate and make you sweat (moderate-intensity exercise).  Do strength-training exercises at least twice a week.  Watch Your Levels of Cholesterol and Blood Lipids  Have your blood tested for lipids and cholesterol every 5 years starting at 48 years of age. If you are at high risk for heart disease, you should start having your blood tested when you are 48 years old. You may need to have your cholesterol levels checked more often if: ? Your lipid or cholesterol levels are high. ? You are older than 48 years of age. ? You are at high risk for heart disease.  What should I know about cancer screening? Many types of cancers can be detected early and may often be prevented. Lung Cancer  You should be screened every year for lung cancer if: ? You are a current smoker who has smoked for at least 30 years. ? You are a former smoker who has quit within the past 15 years.  Talk to your health care provider about your screening options, when you should start screening, and how often you should be screened.  Colorectal Cancer  Routine colorectal cancer screening usually begins at 48 years of age and should be repeated every 5-10 years until you are 48 years old. You may need to be screened more often if early forms of precancerous polyps or small growths are found. Your health care provider  may recommend screening at an earlier age if you have risk factors for colon cancer.  Your health care provider may recommend using home test kits to check for hidden blood in the stool.  A small camera at the end of a tube can be used to examine your colon (sigmoidoscopy or colonoscopy). This checks for the earliest forms of colorectal cancer.  Prostate and Testicular Cancer  Depending on your age and overall health, your health care provider may do certain tests to screen for prostate and testicular cancer.  Talk to your health care provider about any symptoms or concerns you have about testicular or prostate cancer.  Skin Cancer  Check your skin from head to toe regularly.  Tell your health care provider about any new moles or changes in moles, especially if: ? There is a change in a mole's size, shape, or color. ? You have a mole that is larger than a pencil eraser.  Always use sunscreen. Apply sunscreen liberally and repeat throughout the day.  Protect yourself by wearing long sleeves, pants, a wide-brimmed hat, and sunglasses when outside.  What should I know about heart disease, diabetes, and high blood pressure?  If you are 18-39 years of age, have your blood pressure checked every 3-5 years. If you are 40 years of age or older, have your blood pressure checked every year. You should have your blood pressure measured twice-once when you are at a hospital or clinic, and once when   you are not at a hospital or clinic. Record the average of the two measurements. To check your blood pressure when you are not at a hospital or clinic, you can use: ? An automated blood pressure machine at a pharmacy. ? A home blood pressure monitor.  Talk to your health care provider about your target blood pressure.  If you are between 18-11 years old, ask your health care provider if you should take aspirin to prevent heart disease.  Have regular diabetes screenings by checking your fasting blood  sugar level. ? If you are at a normal weight and have a low risk for diabetes, have this test once every three years after the age of 70. ? If you are overweight and have a high risk for diabetes, consider being tested at a younger age or more often.  A one-time screening for abdominal aortic aneurysm (AAA) by ultrasound is recommended for men aged 65-75 years who are current or former smokers. What should I know about preventing infection? Hepatitis B If you have a higher risk for hepatitis B, you should be screened for this virus. Talk with your health care provider to find out if you are at risk for hepatitis B infection. Hepatitis C Blood testing is recommended for:  Everyone born from [birth year] through 1967.  Anyone with known risk factors for hepatitis C.  Sexually Transmitted Diseases (STDs)  You should be screened each year for STDs including gonorrhea and chlamydia if: ? You are sexually active and are younger than 48 years of age. ? You are older than 48 years of age and your health care provider tells you that you are at risk for this type of infection. ? Your sexual activity has changed since you were last screened and you are at an increased risk for chlamydia or gonorrhea. Ask your health care provider if you are at risk.  Talk with your health care provider about whether you are at high risk of being infected with HIV. Your health care provider may recommend a prescription medicine to help prevent HIV infection.  What else can I do?  Schedule regular health, dental, and eye exams.  Stay current with your vaccines (immunizations).  Do not use any tobacco products, such as cigarettes, chewing tobacco, and e-cigarettes. If you need help quitting, ask your health care provider.  Limit alcohol intake to no more than 2 drinks per day. One drink equals 12 ounces of beer, 5 ounces of wine, or 1 ounces of hard liquor.  Do not use street drugs.  Do not share needles.  Ask your  health care provider for help if you need support or information about quitting drugs.  Tell your health care provider if you often feel depressed.  Tell your health care provider if you have ever been abused or do not feel safe at home. This information is not intended to replace advice given to you by your health care provider. Make sure you discuss any questions you have with your health care provider. Document Released: 08/16/2007 Document Revised: 10/17/2015 Document Reviewed: 11/21/2014 Elsevier Interactive Patient Education  2018 [employer].  How to Increase Your Level of Physical Activity Getting regular physical activity is important for your overall health and well-being. Most people do not get enough exercise. There are easy ways to increase your level of physical activity, even if you have not been very active in the past or you are just starting out. Why is physical activity important? Physical activity has many  short-term and long-term health benefits. Regular exercise can:  Help you lose weight or maintain a healthy weight.  Strengthen your muscles and bones.  Boost your mood and improve self-esteem.  Reduce your risk of certain long-term (chronic) diseases, like heart disease, cancer, and diabetes.  Help you stay capable of walking and moving around (mobile) as you age.  Prevent accidents, such as falls, as you age.  Increase life expectancy.  What are the benefits of being physically active on a regular basis? In addition to improving your physical health, being physically active on most days of the week can help you in ways that you may not expect. Benefits of regular physical activity may include:  Feeling good about your body.  Being able to move around more easily and for longer periods of time without getting tired (increased stamina).  Finding new sources of fun and enjoyment.  Meeting new people who share a common interest.  Being able to fight off  illness better (enhanced immunity).  Being able to sleep better.  What can happen if I am not physically active on a regular basis? Not getting enough physical activity can lead to an unhealthy lifestyle and future health problems. This can increase your chances of:  Becoming overweight or obese.  Becoming sick.  Developing chronic illnesses, like heart disease or diabetes.  Having mental health problems, like depression or anxiety.  Having sleep problems.  Having trouble walking or getting yourself around (reduced mobility).  Injuring yourself in a fall as you get older.  What steps can I take to be more physically active?  Check with your health care provider about how to get started. Ask your health care provider what activities are safe for you.  Start out slowly. Walking or doing some simple chair exercises is a good place to start, especially if you have not been active before or for a long time.  Try to find activities that you enjoy. You are more likely to commit to an exercise routine if it does not feel like a chore.  If you have bone or joint problems, choose low-impact exercises, like walking or swimming.  Include physical activity in your everyday routine.  Invite friends or family members to exercise with you. This also will help you commit to your workout plan.  Set goals that you can work toward.  Aim for at least 150 minutes of moderate-intensity exercise each week. Examples of moderate-intensity exercise include walking or riding a bike. Where to find more information:  Centers for Disease Control and Prevention: BowlingGrip.is  President's Council on [employer], Sports & Nutrition www.http://villegas.org/  ChooseMyPlate: WirelessMortgages.dk Contact a health care provider if:  You have headaches, muscle aches, or joint pain.  You feel dizzy or light-headed while exercising.  You faint.  You have  chest pain while exercising. Summary  Exercise benefits your mind and body at any age, even if you are just starting out.  If you have a chronic illness or have not been active for a while, check with your health care provider before increasing your physical activity.  Choose activities that are safe and enjoyable for you.Ask your health care provider what activities are safe for you.  Start slowly. Tell your health care provider if you have problems as you start to increase your activity level. This information is not intended to replace advice given to you by your health care provider. Make sure you discuss any questions you have with your health care provider. Document Released:  02/07/2016 Document Revised: 02/07/2016 Document Reviewed: 02/07/2016 Elsevier Interactive Patient Education  2018 Elsevier Inc.  Preventing Heart Failure Heart failure is a condition in which the heart has trouble pumping blood. This may mean that the heart cannot pump enough blood out to the body, or that the heart does not fill up with enough blood. Either of those problems can lead to symptoms such as fatigue, trouble breathing, and swelling throughout the body. This is a common medical condition that affects not only the heart, but the entire body. Making certain nutrition and lifestyle changes can help you prevent heart failure and avoid serious health problems. What nutrition changes can be made?  If you are overweight or obese, reduce how many calories you eat each day so that you lose weight. Work with your health care provider or a diet and nutrition specialist (dietitian) to determine how many calories you need each day.  Eat foods that are low in salt (sodium). Avoid adding extra salt to foods.  Eat a well-balanced diet that includes a lot of: ? Fresh fruits and vegetables. ? Whole grains. ? Lean meats. ? Beans. ? Fat-free or low-fat dairy products.  Avoid foods that contain a lot of: ? Trans  fats. ? Saturated fats. ? Sugar. ? Cholesterol. What lifestyle changes can be made?  Do not use any products that contain nicotine or tobacco, such as cigarettes and e-cigarettes. If you need help quitting or reducing how much you smoke, ask your health care provider.  Stop using alcohol, or limit alcohol intake to no more than 1 drink a day for nonpregnant women and 2 drinks a day for men. One drink equals 12 oz of beer, 5 oz of wine, or 1 oz of hard liquor.  Exercise for at least 150 minutes each week, or as much as told by your health care provider. ? Do moderate-intensity exercise, such as brisk walking, bicycling, or water aerobics. ? Ask your health care provider which activities are safe for you.  See a health care provider regularly for screening and wellness checks. Know your heart health indicators, such as: ? Blood pressure. ? Cholesterol levels. ? Blood sugar (glucose) levels. ? Weight and BMI.  If you have diabetes, manage your condition and follow your treatment plan as instructed.  Try to get 7-9 hours of sleep each night. To help with sleep: ? Keep your bedroom cool and dark. ? Do not eat a heavy meal during the hour before you go to bed. ? Do not drink alcohol or caffeinated drinks before bed. ? Avoid screen time before bedtime. This means avoiding television, computers, tablets, and cell phones.  Find ways to relax and manage stress. These may include: ? Breathing exercises. ? Meditation. ? Yoga. ? Listening to music. Why are these changes important?  A well-balanced diet with the appropriate amount of calories can keep your body weight at a healthy level, which reduces strain on your heart.  A low-sodium diet can help keep your blood pressure in a normal range and keep your blood vessels working properly.  Quitting smoking and limiting alcohol intake can reduce harmful effects that these substances have on your heart and blood vessels.  Regular exercise  can keep your heart strong so it can pump blood normally.  Managing diabetes helps your blood circulate and can help you maintain a healthy weight.  Managing stress helps to reduce the risk of high blood pressure and heart problems. What can happen if changes are not made?  Heart failure can cause very serious problems that may get worse over time, such as:  Extreme fatigue during normal physical activities.  Shortness of breath or trouble breathing.  Swelling in your abdomen, legs, ankles, feet, or neck.  Fluid buildup throughout the body.  Weight gain.  Cough.  Frequent urination.  What can I do to lower my risk? You may be able to lower your risk of heart failure by:  Losing weight or keeping your weight under control.  Working with your health care provider to manage your: ? Cholesterol. ? Blood pressure. ? Diabetes, if this applies.  Eating a healthy diet.  Exercising regularly.  Avoiding unhealthy habits, such as smoking, drinking, or using drugs.  Getting plenty of sleep.  Managing your stress.  How is this treated? Heart failure cannot be cured except by heart transplant, but treatment can help to improve your quality of life. Treatment may include:  Medicines to help: ? Lower blood pressure. ? Remove excess sodium from your body. ? Relax blood vessels. ? Improve heart function. ? Control other symptoms of heart failure.  Surgery to open blocked coronary arteries or repair damaged heart valves.  Implantation of a biventricular pacemaker to improve heart muscle function (cardiac resynchronization therapy). This device paces both the right ventricle and left ventricle.  Implantation of a device to treat serious abnormal heart rhythms (implantable cardioverter defibrillator, ICD).  Implantation of a mechanical heart pump to improve the pumping ability of your heart (left ventricular assist device, LVAD).  Heart transplant. This treatment is considered  for certain people who do not improve with other treatments.  Where to find more information:  National Heart, Lung, and Blood Institute: PowderCalcium.frwww.nhlbi.nih.gov/health/health-topics/topics/hf  Centers for Disease Control and Prevention: https://www.franklin-jones.com/www.cdc.gov/dhdsp/data_statistics/fact_sheets/fs_heart_failure.htm  NIH Senior Health: https://www.bailey.com/nihseniorhealth.gov/heartfailure/heartfailuredefined/01.html  American Heart Association: 1234567890www.heart.org/HEARTORG/Conditions/HeartFailure/Heart-Failure_UCM_002019_SubHomePage.jsp Contact a health care provider if:  You have rapid weight gain.  You have increasing shortness of breath that is unusual for you.  You tire easily, or you are unable to participate in your usual activities.  You cough more than normal, especially with physical activity.  You have any swelling or more swelling in areas such as your hands, feet, ankles, or abdomen. Summary  Heart failure can be prevented by making changes to your diet and your lifestyle.  It is important to eat a healthy diet, manage your weight, exercise regularly, manage stress, avoid drugs and alcohol, and keep your cholesterol and blood pressure under control.  Heart failure can cause very serious problems over time. This information is not intended to replace advice given to you by your health care provider. Make sure you discuss any questions you have with your health care provider. Document Released: 10/09/2015 Document Revised: 10/09/2015 Document Reviewed: 10/09/2015 Elsevier Interactive Patient Education  2018 Elsevier Inc.  Preventing Unhealthy Kinder Morgan EnergyWeight Gain, Adult Staying at a healthy weight is important. When fat builds up in your body, you may become overweight or obese. These conditions put you at greater risk for developing certain health problems, such as heart disease, diabetes, sleeping problems, joint problems, and some cancers. Unhealthy weight gain is often the result of making unhealthy choices in what you  eat. It is also a result of not getting enough exercise. You can make changes to your lifestyle to prevent obesity and stay as healthy as possible. What nutrition changes can be made? To maintain a healthy weight and prevent obesity:  Eat only as much as your body needs. To do this: ? Pay attention to signs that  you are hungry or full. Stop eating as soon as you feel full. ? If you feel hungry, try drinking water first. Drink enough water so your urine is clear or pale yellow. ? Eat smaller portions. ? Look at serving sizes on food labels. Most foods contain more than one serving per container. ? Eat the recommended amount of calories for your gender and activity level. While most active people should eat around 2,000 calories per day, if you are trying to lose weight or are not very active, you main need to eat less calories. Talk to your health care provider or dietitian about how many calories you should eat each day.  Choose healthy foods, such as: ? Fruits and vegetables. Try to fill at least half of your plate at each meal with fruits and vegetables. ? Whole grains, such as whole wheat bread, brown rice, and quinoa. ? Lean meats, such as chicken or fish. ? Other healthy proteins, such as beans, eggs, or tofu. ? Healthy fats, such as nuts, seeds, fatty fish, and olive oil. ? Low-fat or fat-free dairy.  Check food labels and avoid food and drinks that: ? Are high in calories. ? Have added sugar. ? Are high in sodium. ? Have saturated fats or trans fats.  Limit how much you eat of the following foods: ? Prepackaged meals. ? Fast food. ? Fried foods. ? Processed meat, such as bacon, sausage, and deli meats. ? Fatty cuts of red meat and poultry with skin.  Cook foods in healthier ways, such as by baking, broiling, or grilling.  When grocery shopping, try to shop around the outside of the store. This helps you buy mostly fresh foods and avoid canned and prepackaged foods.  What  lifestyle changes can be made?  Exercise at least 30 minutes 5 or more days each week. Exercising includes brisk walking, yard work, biking, running, swimming, and team sports like basketball and soccer. Ask your health care provider which exercises are safe for you.  Do not use any products that contain nicotine or tobacco, such as cigarettes and e-cigarettes. If you need help quitting, ask your health care provider.  Limit alcohol intake to no more than 1 drink a day for nonpregnant women and 2 drinks a day for men. One drink equals 12 oz of beer, 5 oz of wine, or 1 oz of hard liquor.  Try to get 7-9 hours of sleep each night. What other changes can be made?  Keep a food and activity journal to keep track of: ? What you ate and how many calories you had. Remember to count sauces, dressings, and side dishes. ? Whether you were active, and what exercises you did. ? Your calorie, weight, and activity goals.  Check your weight regularly. Track any changes. If you notice you have gained weight, make changes to your diet or activity routine.  Avoid taking weight-loss medicines or supplements. Talk to your health care provider before starting any new medicine or supplement.  Talk to your health care provider before trying any new diet or exercise plan. Why are these changes important? Eating healthy, staying active, and having healthy habits not only help prevent obesity, they also:  Help you to manage stress and emotions.  Help you to connect with friends and family.  Improve your self-esteem.  Improve your sleep.  Prevent long-term health problems.  What can happen if changes are not made? Being obese or overweight can cause you to develop joint or bone problems,  which can make it hard for you to stay active or do activities you enjoy. Being obese or overweight also puts stress on your heart and lungs and can lead to health problems like diabetes, heart disease, and some  cancers. Where to find more information: Talk with your health care provider or a dietitian about healthy eating and healthy lifestyle choices. You may also find other information through these resources:  U.[place]. Department of Agriculture MyPlate: https://ball-collins.biz/  American Heart Association: www.heart.org  Centers for Disease Control and Prevention: FootballExhibition.com.br  Summary  Staying at a healthy weight is important. It helps prevent certain diseases and health problems, such as heart disease, diabetes, joint problems, sleep disorders, and some cancers.  Being obese or overweight can cause you to develop joint or bone problems, which can make it hard for you to stay active or do activities you enjoy.  You can prevent unhealthy weight gain by eating a healthy diet, exercising regularly, not smoking, limiting alcohol, and getting enough sleep.  Talk with your health care provider or a dietitian for guidance about healthy eating and healthy lifestyle choices. This information is not intended to replace advice given to you by your health care provider. Make sure you discuss any questions you have with your health care provider. Document Released: 02/19/2016 Document Revised: 03/26/2016 Document Reviewed: 03/26/2016 Elsevier Interactive Patient Education  [employer].

## 2017-01-21 NOTE — Progress Notes (Signed)
ANNUAL PREVENTATIVE VISIT AND CPE  Subjective:  Bradley Flores is a 46 y.o. male who presents for an annual physical exam.  Patient feels well and is without complaint.    Lab Results  Component Value Date   CHOL 171 02/28/2016   HDL 47 02/28/2016   LDLCALC 92 02/28/2016   TRIG 160 (H) 02/28/2016   CHOLHDL 3.6 02/28/2016      Names of Other Physician/Practitioners you currently use: Patient Care Team: Massie MaroonHollis, Shana Younge M, FNP as PCP - General (Family Medicine)   Medication Review: Current Outpatient Medications on File Prior to Visit  Medication Sig Dispense Refill  . cetirizine (ZYRTEC) 10 MG tablet Take 10 mg by mouth daily.    Marland Kitchen. albuterol (PROVENTIL HFA;VENTOLIN HFA) 108 (90 Base) MCG/ACT inhaler Inhale 1-2 puffs into the lungs every 4 (four) hours as needed (cough, shortness of breath or wheezing.). (Patient not taking: Reported on 01/01/2017) 1 Inhaler 0  . cyclobenzaprine (FLEXERIL) 5 MG tablet Take 1 tablet (5 mg total) by mouth 3 (three) times daily as needed (muscle soreness). (Patient not taking: Reported on 01/21/2017) 30 tablet 0  . naproxen (NAPROSYN) 500 MG tablet Take 1 po BID with food prn pain (Patient not taking: Reported on 01/21/2017) 30 tablet 0  . Olopatadine HCl 0.2 % SOLN Apply 2 drops to eye 3 (three) times daily as needed. (Patient not taking: Reported on 01/01/2017) 2.5 mL 3   Current Facility-Administered Medications on File Prior to Visit  Medication Dose Route Frequency Provider Last Rate Last Dose  . albuterol (PROVENTIL) (2.5 MG/3ML) 0.083% nebulizer solution 2.5 mg  2.5 mg Nebulization Once Carmelina DaneAnderson, Jeffery S, MD        Current Problems (verified) Patient Active Problem List   Diagnosis Date Noted  . Preventative health care 01/18/2015  . Burning sensation of feet 01/18/2015  . Oral mucosal lesion 01/17/2014  . Vision changes 01/17/2014  . Prediabetes 01/17/2014  . Allergic rhinitis     Screening Tests Health Maintenance  Topic  Date Due  . INFLUENZA VACCINE  10/01/2016  . TETANUS/TDAP  01/17/2025  . HIV Screening  Completed    Immunization History  Administered Date(s) Administered  . Influenza,inj,Quad PF,6+ Mos 01/18/2015, 02/28/2016  . Tdap 01/17/2011, 01/18/2015     Allergies as of 01/21/2017   No Known Allergies     Medication List        Accurate as of 01/21/17  8:12 AM. Always use your most recent med list.          albuterol 108 (90 Base) MCG/ACT inhaler Commonly known as:  PROVENTIL HFA;VENTOLIN HFA Inhale 1-2 puffs into the lungs every 4 (four) hours as needed (cough, shortness of breath or wheezing.).   cetirizine 10 MG tablet Commonly known as:  ZYRTEC Take 10 mg by mouth daily.   cyclobenzaprine 5 MG tablet Commonly known as:  FLEXERIL Take 1 tablet (5 mg total) by mouth 3 (three) times daily as needed (muscle soreness).   naproxen 500 MG tablet Commonly known as:  NAPROSYN Take 1 po BID with food prn pain   Olopatadine HCl 0.2 % Soln Apply 2 drops to eye 3 (three) times daily as needed.       Past Surgical History:  Procedure Laterality Date  . FRACTURE SURGERY     Family History  Problem Relation Age of Onset  . Hypertension Mother   . Diabetes Father   . Diabetes Sister   . Diabetes Sister     History  reviewed: allergies, current medications, past family history, past medical history, past social history, past surgical history and problem list   Tobacco Social History   Tobacco Use  . Smoking status: Never Smoker  . Smokeless tobacco: Never Used  Substance Use Topics  . Alcohol use: No    Alcohol/week: 0.0 oz  . Drug use: No   He does not smoke.  Patient is not a former smoker. Are there smokers in your home (other than you)?  No  Alcohol Current alcohol use: none  Caffeine Current caffeine use: tea 5-6 /day  Exercise Current exercise: none  Nutrition/Diet Current diet: in general, a "healthy" diet    Cardiac risk factors: sedentary  lifestyle.  Depression Screen  Vision Difficulties: No  Hearing Difficulties: No  Cognition  Do you feel that you have a problem with memory? No  Do you feel safe at home?  No  Advanced directives Does patient have a Health Care Power of Attorney? No Does patient have a Living Will? No Review of Systems  Constitutional: Negative.   HENT: Negative.   Eyes: Negative.   Respiratory: Negative.   Cardiovascular: Negative.  Negative for chest pain, palpitations and orthopnea.  Gastrointestinal: Negative.   Genitourinary: Negative.   Musculoskeletal: Negative.   Skin: Negative.   Neurological: Negative.   Endo/Heme/Allergies: Negative.   Psychiatric/Behavioral: Negative.  Negative for depression, substance abuse and suicidal ideas.    Objective:     Blood pressure 122/76, pulse 61, temperature 97.7 F (36.5 C), temperature source Oral, resp. rate 16, height 6\' 3"  (1.905 m), weight 220 lb (99.8 kg), SpO2 100 %. Body mass index is 27.5 kg/m.   HEENT: normocephalic, sclerae anicteric, TMs pearly, nares patent, no discharge or erythema, pharynx normal Oral cavity: MMM, no lesions Neck: supple, no lymphadenopathy, no thyromegaly, no masses Heart: RRR, normal S1, S2, no murmurs Lungs: CTA bilaterally, no wheezes, rhonchi, or rales Abdomen: +bs, soft, non tender, non distended, no masses, no hepatomegaly, no splenomegaly Musculoskeletal: nontender, no swelling, no obvious deformity Extremities: no edema, no cyanosis, no clubbing Pulses: 2+ symmetric, upper and lower extremities, normal cap refill Neurological: alert, oriented x 3, CN2-12 intact, strength normal upper extremities and lower extremities, sensation normal throughout, DTRs 2+ throughout, no cerebellar signs, gait normal Testicular: B/L testicle descended. No masses Rectal/Prostate: No nodules or localized areas of softness, tenderness or induration palpated. Rectal tone normal. No external or internal hemorrhoids  noted. Psychiatric: normal affect, behavior normal, pleasant   Assessment:  Patient denies any difficulties at home. No trouble with ADLs, depression or falls. No recent changes to vision or hearing. Is UTD with immunizations. Is UTD with screening. Discussed Advanced Directives, patient agrees to bring [place] copies of documents if can. Encouraged heart healthy diet, exercise as tolerated and adequate sleep. Declines flu shot. Testicular and rectal examination done today.     Plan:   During the course of the visit the patient was educated and counseled about appropriate screening and preventive services including:    Pneumococcal vaccine   Influenza vaccine  Td vaccine  Screening electrocardiogram  Bone densitometry screening  Colorectal cancer screening  Diabetes screening  Glaucoma screening  Nutrition counseling   Advanced directives: requested  Screening recommendations, referrals: Vaccinations: Please see documentation below and orders this visit.  Nutrition assessed and recommended  Colonoscopy not indicated Recommended yearly ophthalmology/optometry visit for glaucoma screening and checkup Recommended yearly dental visit for hygiene and checkup Advanced directives - requested  - COMPLETE METABOLIC PANEL WITH  GFR - CBC with Differential - TSH - HgB A1c - Lipid Panel - Vitamin D, 25-hydroxy - EKG 12-Lead   Nolon NationsLaChina Moore Kendle Turbin  MSN, FNP-C Patient Care [facility] [address]  [place], [place] [address] [phone]

## 2017-01-22 LAB — CBC WITH DIFFERENTIAL/PLATELET
BASOS ABS: 29 {cells}/uL (ref 0–200)
Basophils Relative: 0.6 %
EOS PCT: 4.4 %
Eosinophils Absolute: 211 cells/uL (ref 15–500)
HEMATOCRIT: 43.8 % (ref 38.5–50.0)
HEMOGLOBIN: 15 g/dL (ref 13.2–17.1)
LYMPHS ABS: 1901 {cells}/uL (ref 850–3900)
MCH: 29.1 pg (ref 27.0–33.0)
MCHC: 34.2 g/dL (ref 32.0–36.0)
MCV: 85 fL (ref 80.0–100.0)
MPV: 11.1 fL (ref 7.5–12.5)
Monocytes Relative: 11.8 %
NEUTROS ABS: 2093 {cells}/uL (ref 1500–7800)
Neutrophils Relative %: 43.6 %
Platelets: 186 10*3/uL (ref 140–400)
RBC: 5.15 10*6/uL (ref 4.20–5.80)
RDW: 12.4 % (ref 11.0–15.0)
Total Lymphocyte: 39.6 %
WBC: 4.8 10*3/uL (ref 3.8–10.8)
WBCMIX: 566 {cells}/uL (ref 200–950)

## 2017-01-22 LAB — COMPLETE METABOLIC PANEL WITH GFR
AG RATIO: 1.5 (calc) (ref 1.0–2.5)
ALKALINE PHOSPHATASE (APISO): 88 U/L (ref 40–115)
ALT: 13 U/L (ref 9–46)
AST: 15 U/L (ref 10–40)
Albumin: 4.3 g/dL (ref 3.6–5.1)
BUN: 19 mg/dL (ref 7–25)
CO2: 28 mmol/L (ref 20–32)
CREATININE: 0.96 mg/dL (ref 0.60–1.35)
Calcium: 9.2 mg/dL (ref 8.6–10.3)
Chloride: 106 mmol/L (ref 98–110)
GFR, EST AFRICAN AMERICAN: 108 mL/min/{1.73_m2} (ref >=60)
GFR, Est Non African American: 93 mL/min/{1.73_m2} (ref >=60)
GLOBULIN: 2.9 g/dL (ref 1.9–3.7)
Glucose, Bld: 110 mg/dL — ABNORMAL HIGH (ref 65–99)
Potassium: 4.7 mmol/L (ref 3.5–5.3)
Sodium: 141 mmol/L (ref 135–146)
Total Bilirubin: 0.3 mg/dL (ref 0.2–1.2)
Total Protein: 7.2 g/dL (ref 6.1–8.1)

## 2017-01-22 LAB — TSH: TSH: 2.36 mIU/L (ref 0.40–4.50)

## 2017-01-22 LAB — LIPID PANEL
CHOL/HDL RATIO: 3.2 (calc) (ref <5.0)
CHOLESTEROL: 158 mg/dL (ref <200)
HDL: 50 mg/dL (ref >40)
LDL CHOLESTEROL (CALC): 87 mg/dL
Non-HDL Cholesterol (Calc): 108 mg/dL (calc) (ref <130)
TRIGLYCERIDES: 109 mg/dL (ref <150)

## 2017-01-22 LAB — VITAMIN D 25 HYDROXY (VIT D DEFICIENCY, FRACTURES): VIT D 25 HYDROXY: 12 ng/mL — AB (ref 30–100)

## 2017-01-26 ENCOUNTER — Other Ambulatory Visit: Payer: Self-pay | Admitting: Family Medicine

## 2017-01-26 ENCOUNTER — Telehealth: Payer: Self-pay

## 2017-01-26 DIAGNOSIS — E559 Vitamin D deficiency, unspecified: Secondary | ICD-10-CM

## 2017-01-26 MED ORDER — ERGOCALCIFEROL 1.25 MG (50000 UT) PO CAPS: 50000.0000 [IU] | ORAL_CAPSULE | ORAL | 0 refills | Status: DC

## 2017-01-26 NOTE — Telephone Encounter (Signed)
-----   Message from Massie MaroonLachina M Hollis, [place]NP sent at 01/26/2017 11:21 AM EST ----- Regarding: lab results Please inform patient that labs are consistent with vitamin D deficiency. Will start weekly Drisdol, 50, 000 units. Have patient schedule follow up in 6 months.   Thanks

## 2017-01-26 NOTE — Telephone Encounter (Signed)
Called, no answer. Left a message for patient to call back. Thanks!  

## 2017-01-26 NOTE — Progress Notes (Signed)
Meds ordered this encounter  Medications  . ergocalciferol (DRISDOL) 50000 units capsule    Sig: Take 1 capsule (50,000 Units total) by mouth once a week.    Dispense:  30 capsule    Refill:  0    Nolon NationsLaChina Moore Najla Aughenbaugh  MSN, FNP-C Patient [facility] [address]  [place], [place] [address] [phone]

## 2017-01-27 NOTE — Telephone Encounter (Signed)
Called, no answer. Left a message for patient to call back. Thanks!  

## 2017-01-28 ENCOUNTER — Other Ambulatory Visit: Payer: Self-pay

## 2017-01-28 DIAGNOSIS — E559 Vitamin D deficiency, unspecified: Secondary | ICD-10-CM

## 2017-01-28 MED ORDER — ERGOCALCIFEROL 1.25 MG (50000 UT) PO CAPS: 50000.0000 [IU] | ORAL_CAPSULE | ORAL | 0 refills | Status: DC

## 2017-01-28 NOTE — Telephone Encounter (Signed)
Patient called back and I advised of vitamin D deficiency and to start vitamin D once weekly. Asked that patient return in 6 months for a re-check and this was scheduled for May 29th. Thanks!

## 2017-05-04 ENCOUNTER — Other Ambulatory Visit: Payer: Self-pay

## 2017-05-04 ENCOUNTER — Ambulatory Visit (INDEPENDENT_AMBULATORY_CARE_PROVIDER_SITE_OTHER): Payer: BLUE CROSS/BLUE SHIELD | Admitting: Family Medicine

## 2017-05-04 ENCOUNTER — Encounter: Payer: Self-pay | Admitting: Family Medicine

## 2017-05-04 DIAGNOSIS — M25569 Pain in unspecified knee: Secondary | ICD-10-CM | POA: Insufficient documentation

## 2017-05-04 DIAGNOSIS — M25561 Pain in right knee: Secondary | ICD-10-CM | POA: Diagnosis not present

## 2017-05-04 MED ORDER — METHYLPREDNISOLONE ACETATE 40 MG/ML IJ SUSP
40.0000 mg | Freq: Once | INTRAMUSCULAR | Status: AC
  Administered 2017-05-04: 40 mg via INTRAMUSCULAR

## 2017-05-04 MED ORDER — IBUPROFEN 600 MG PO TABS: 600.0000 mg | ORAL_TABLET | Freq: Three times a day (TID) | ORAL | 3 refills | Status: DC | PRN

## 2017-05-04 NOTE — Progress Notes (Signed)
   Subjective:    Patient ID: Bradley Flores , male   DOB: 09/28/1970 , 47 y.o..   MRN: [id number]  HPI  Bradley Flores is here for  Chief Complaint  Patient presents with  . Knee Pain    right knee pain for 16 days no injury     1. Right Knee Pain: She has had right knee pain for about 2-3 weeks.  No injury.  He notes that he prays 5 times a day on his knees and it makes the pain worse.  Sometimes the right knee swells.  He is not taking any medicine.  This is never happened before.  He plays soccer often and sometimes his knee hurts during this to.  Denies any numbness or tingling.  Denies any muscle weakness.  Review of Systems: Per HPI.   Past Medical History: Patient Active Problem List   Diagnosis Date Noted  . Knee pain 05/04/2017  . Vitamin D deficiency 01/26/2017  . Preventative health care 01/18/2015  . Burning sensation of feet 01/18/2015  . Oral mucosal lesion 01/17/2014  . Vision changes 01/17/2014  . Prediabetes 01/17/2014  . Allergic rhinitis     Medications: reviewed   Social Hx:  reports that  has never smoked. he has never used smokeless tobacco.   Objective:   BP 102/68 (BP Location: Left Arm, Patient Position: Sitting, Cuff Size: Large)   Pulse 66   Temp 98 F (36.7 C) (Oral)   Ht 6\' 3"  (1.905 m)   Wt 226 lb (102.5 kg)   SpO2 99%   BMI 28.25 kg/m  Physical Exam  Gen: NAD, alert, cooperative with exam, well-appearing Knee: Normal to inspection with no erythema or effusion or obvious bony abnormalities.  Palpation normal with no warmth. Positive medial joint line tenderness, patellar tenderness, or condyle tenderness. ROM full in flexion and extension and lower leg rotation. Ligaments with solid consistent endpoints including ACL, PCL, LCL, MCL. Negative [place] tests. Non painful patellar compression. Patellar glide without crepitus. Patellar and quadriceps tendons unremarkable. Hamstring and quadriceps strength is normal.     Right Knee injection Written and verbal consent was obtained after discussing the risks and benefits of the procedure with the patient. The anterior rightknee was cleansed in a sterile fashion with betadine. 40 mg depo medroland 3 cc 1% Lidocaine was injected using an anterolateral approach using a 5 cc syringe and 25 gauge needle. No complications were encountered. Minimal blood loss. A band aid was applied.   Assessment & Plan:  Knee pain Right knee pain for the last 2 weeks.  No injury.  Exam showing tenderness on the medial joint line.  Likely patient's pain secondary to arthritis and worsened by praying on knees 5 times a day.  No signs of ligamentous injury or septic arthritis.. -Knee injection performed today, see above procedure note -Discussed ibuprofen 600 mg every 6 hours as needed for pain -Discussed playing in a seated position rather than on knees to relieve pressure -Discussed continuing to stay active including playing soccer -Return precautions discussed   Meds ordered this encounter  Medications  . ibuprofen (ADVIL,MOTRIN) 600 MG tablet    Sig: Take 1 tablet (600 mg total) by mouth every 8 (eight) hours as needed.    Dispense:  30 tablet    Refill:  3  . methylPREDNISolone acetate (DEPO-MEDROL) injection 40 mg    Anders Simmondshristina Gambino, MD [facility], PGY-3

## 2017-05-04 NOTE — Assessment & Plan Note (Addendum)
Right knee pain for the last 2 weeks.  No injury.  Exam showing tenderness on the medial joint line.  Likely patient's pain secondary to arthritis and worsened by praying on knees 5 times a day.  No signs of ligamentous injury or septic arthritis.. -Knee injection performed today, see above procedure note -Discussed ibuprofen 600 mg every 6 hours as needed for pain -Discussed playing in a seated position rather than on knees to relieve pressure -Discussed continuing to stay active including playing soccer -Return precautions discussed

## 2017-05-18 DIAGNOSIS — H5203 Hypermetropia, bilateral: Secondary | ICD-10-CM | POA: Diagnosis not present

## 2017-07-29 ENCOUNTER — Other Ambulatory Visit: Payer: BLUE CROSS/BLUE SHIELD

## 2017-11-10 ENCOUNTER — Encounter: Payer: Self-pay | Admitting: Urgent Care

## 2017-11-10 ENCOUNTER — Other Ambulatory Visit: Payer: Self-pay

## 2017-11-10 ENCOUNTER — Ambulatory Visit (INDEPENDENT_AMBULATORY_CARE_PROVIDER_SITE_OTHER): Payer: BLUE CROSS/BLUE SHIELD | Admitting: Urgent Care

## 2017-11-10 VITALS — BP 122/67 | HR 70 | Temp 98.3°F | Resp 16 | Ht 75.0 in | Wt 223.0 lb

## 2017-11-10 DIAGNOSIS — M549 Dorsalgia, unspecified: Secondary | ICD-10-CM | POA: Diagnosis not present

## 2017-11-10 DIAGNOSIS — M545 Low back pain, unspecified: Secondary | ICD-10-CM

## 2017-11-10 DIAGNOSIS — M62838 Other muscle spasm: Secondary | ICD-10-CM | POA: Diagnosis not present

## 2017-11-10 LAB — POCT URINALYSIS DIP (MANUAL ENTRY)
BILIRUBIN UA: NEGATIVE mg/dL
Glucose, UA: NEGATIVE mg/dL
Leukocytes, UA: NEGATIVE
NITRITE UA: NEGATIVE
PH UA: 5.5 (ref 5.0–8.0)
Spec Grav, UA: >=1.03 — AB (ref 1.010–1.025)
UROBILINOGEN UA: 1 U/dL

## 2017-11-10 MED ORDER — CYCLOBENZAPRINE HCL 10 MG PO TABS: 10.0000 mg | ORAL_TABLET | Freq: Two times a day (BID) | ORAL | 1 refills | Status: DC | PRN

## 2017-11-10 MED ORDER — KETOROLAC TROMETHAMINE 60 MG/2ML IM SOLN
60.0000 mg | Freq: Once | INTRAMUSCULAR | Status: AC
  Administered 2017-11-10: 60 mg via INTRAMUSCULAR

## 2017-11-10 MED ORDER — MELOXICAM 7.5 MG PO TABS: 7.5000 mg | ORAL_TABLET | Freq: Every day | ORAL | 2 refills | Status: DC

## 2017-11-10 NOTE — Progress Notes (Signed)
    MRN: [id number] DOB: May 25, 1970  Subjective:   Tailor Luden is a 47 y.o. male presenting for 4 day history of mid back pain, tightness, subjective fever, chills. Has tried ibuprofen with good relief. Patient has worked in [employer] for work.  Does not hydrate well.  Denies falls, trauma, history of back surgeries.  Denies weakness, numbness or tingling, incontinence, difficulty urinating, hematuria.  Deonte has a current medication list which includes the following prescription(s): ibuprofen and ergocalciferol. Also has No Known Allergies.  Jaycieon  has a past medical history of Allergic rhinitis, Allergy, and Asthma. Also  has a past surgical history that includes Fracture surgery.  Objective:   Vitals: BP 122/67   Pulse 70   Temp 98.3 F (36.8 C) (Oral)   Resp 16   Ht 6\' 3"  (1.905 m)   Wt 223 lb (101.2 kg)   SpO2 96%   BMI 27.87 kg/m   Physical Exam  Constitutional: He is oriented to person, place, and time. He appears well-developed and well-nourished.  Cardiovascular: Normal rate.  Pulmonary/Chest: Effort normal.  Musculoskeletal:       Thoracic back: He exhibits decreased range of motion (flexion, extension), tenderness (over spasms) and spasm (significant and very obvious over mid-thoracic paraspinal muscles). He exhibits no bony tenderness, no swelling, no edema and no deformity.       Lumbar back: He exhibits decreased range of motion (flexion, extension). He exhibits no tenderness, no bony tenderness, no swelling, no edema, no deformity and no spasm.  Neurological: He is alert and oriented to person, place, and time. He displays normal reflexes.   Results for orders placed or performed in visit on 11/10/17 (from the past 24 hour(s))  POCT urinalysis dipstick     Status: Abnormal   Collection Time: 11/10/17  9:18 AM  Result Value Ref Range   Color, UA orange (A) yellow   Clarity, UA clear clear   Glucose, UA negative negative mg/dL     Bilirubin, UA small (A) negative   Ketones, POC UA negative negative mg/dL   Spec Grav, UA >=2.263 (A) 1.010 - 1.025   Blood, UA trace-lysed (A) negative   pH, UA 5.5 5.0 - 8.0   Protein Ur, POC =30 (A) negative mg/dL   Urobilinogen, UA 1.0 0.2 or 1.0 E.U./dL   Nitrite, UA Negative Negative   Leukocytes, UA Negative Negative   Assessment and Plan :   Muscle spasm  Mid back pain - Plan: ketorolac (TORADOL) injection 60 mg, POCT urinalysis dipstick  Acute bilateral low back pain without sciatica - Plan: ketorolac (TORADOL) injection 60 mg  Counseled on need for adequate hydration daily.  Recommended modification of physical activities.  IM Toradol in clinic.  Use Flexeril meloxicam at home.  Counseled patient on potential for adverse effects with medications prescribed today, patient verbalized understanding. Return to clinic precautions reviewed.  Wallis Bamberg, PA-C Primary Care at [facility] [phone] 11/10/2017  8:30 AM

## 2017-11-10 NOTE — Patient Instructions (Addendum)
Hydrate well with at least 2 liters (64 ounces) of water daily. Use Flexeril 1-2 times per day. If it makes you sleepy then use Flexeril at bed time only. It is a muscle relaxant that can help the spasms. Use meloxicam 1-2 pills per day. It is a medication that helps with pain and inflammation associated with your spasms.     Muscle Cramps and Spasms Muscle cramps and spasms occur when a muscle or muscles tighten and you have no control over this tightening (involuntary muscle contraction). They are a common problem and can develop in any muscle. The most common place is in the calf muscles of the leg. Muscle cramps and muscle spasms are both involuntary muscle contractions, but there are some differences between the two:  Muscle cramps are painful. They come and go and may last a few seconds to 15 minutes. Muscle cramps are often more forceful and last longer than muscle spasms.  Muscle spasms may or may not be painful. They may also last just a few seconds or much longer.  Certain medical conditions, such as diabetes or Parkinson disease, can make it more likely to develop cramps or spasms. However, cramps or spasms are usually not caused by a serious underlying problem. Common causes include:  Overexertion.  Overuse from repetitive motions, or doing the same thing over and over.  Remaining in a certain position for a long period of time.  Improper preparation, form, or technique while playing a sport or doing an activity.  Dehydration.  Injury.  Side effects of some medicines.  Abnormally low levels of the salts and ions in your blood (electrolytes), especially potassium and calcium. This could happen if you are taking water pills (diuretics) or if you are pregnant.  In many cases, the cause of muscle cramps or spasms is unknown. Follow these instructions at home:  Stay well hydrated. Drink enough fluid to keep your urine clear or pale yellow.  Try massaging, stretching, and  relaxing the affected muscle.  If directed, apply heat to tight or tense muscles as often as told by your health care provider. Use the heat source that your health care provider recommends, such as a moist heat pack or a heating pad. ? Place a towel between your skin and the heat source. ? Leave the heat on for 20-30 minutes. ? Remove the heat if your skin turns bright red. This is especially important if you are unable to feel pain, heat, or cold. You may have a greater risk of getting burned.  If directed, put ice on the affected area. This may help if you are sore or have pain after a cramp or spasm. ? Put ice in a plastic bag. ? Place a towel between your skin and the bag. ? Leavethe ice on for 20 minutes, 2-3 times a day.  Take over-the-counter and prescription medicines only as told by your health care provider.  Pay attention to any changes in your symptoms. Contact a health care provider if:  Your cramps or spasms get more severe or happen more often.  Your cramps or spasms do not improve over time. This information is not intended to replace advice given to you by your health care provider. Make sure you discuss any questions you have with your health care provider. Document Released: 08/09/2001 Document Revised: 03/21/2015 Document Reviewed: 11/21/2014 Elsevier Interactive Patient Education  [employer].     If you have lab work done today you will be contacted with your  lab results within the next 2 weeks.  If you have not heard from [place] then please contact us. The fastest way to get your results is to register for My Chart.   IF you received an x-ray today, you will receive an invoice from [facility]. Please contact [facility] at [phone] with questions or concerns regarding your invoice.   IF you received labwork today, you will receive an invoice from [facility]. Please contact [facility] at [phone] with questions or concerns regarding  your invoice.   Our billing staff will not be able to assist you with questions regarding bills from these companies.  You will be contacted with the lab results as soon as they are available. The fastest way to get your results is to activate your My Chart account. Instructions are located on the last page of this paperwork. If you have not heard from [place] regarding the results in 2 weeks, please contact this office.

## 2018-01-05 ENCOUNTER — Other Ambulatory Visit: Payer: Self-pay

## 2018-01-05 ENCOUNTER — Ambulatory Visit (INDEPENDENT_AMBULATORY_CARE_PROVIDER_SITE_OTHER): Payer: BLUE CROSS/BLUE SHIELD | Admitting: Family Medicine

## 2018-01-05 ENCOUNTER — Encounter: Payer: Self-pay | Admitting: Family Medicine

## 2018-01-05 VITALS — BP 112/78 | HR 63 | Temp 98.2°F | Ht 75.0 in | Wt 225.8 lb

## 2018-01-05 DIAGNOSIS — Z0000 Encounter for general adult medical examination without abnormal findings: Secondary | ICD-10-CM

## 2018-01-05 DIAGNOSIS — R739 Hyperglycemia, unspecified: Secondary | ICD-10-CM

## 2018-01-05 DIAGNOSIS — Z23 Encounter for immunization: Secondary | ICD-10-CM

## 2018-01-05 DIAGNOSIS — Z13220 Encounter for screening for lipoid disorders: Secondary | ICD-10-CM | POA: Diagnosis not present

## 2018-01-05 DIAGNOSIS — R7303 Prediabetes: Secondary | ICD-10-CM

## 2018-01-05 DIAGNOSIS — M25561 Pain in right knee: Secondary | ICD-10-CM

## 2018-01-05 LAB — POCT GLYCOSYLATED HEMOGLOBIN (HGB A1C): Hemoglobin A1C: 5.9 % — AB (ref 4.0–5.6)

## 2018-01-05 MED ORDER — METFORMIN HCL 500 MG PO TABS: 500.0000 mg | ORAL_TABLET | Freq: Two times a day (BID) | ORAL | 0 refills | Status: DC

## 2018-01-05 MED ORDER — IBUPROFEN 600 MG PO TABS
600.0000 mg | ORAL_TABLET | Freq: Three times a day (TID) | ORAL | 3 refills | Status: DC | PRN
Start: 2018-01-05 — End: 2021-03-01

## 2018-01-05 NOTE — Progress Notes (Signed)
Subjective:  Bradley Flores is a 47 y.o. male who presents to the [facility] today with a chief complaint of annual physical.   HPI: Patient presents for the end of the calendar year annual physical exam.  He has no new physical complaints and is here because he does so once per year.  No chest pain, SOB, suddent weight changes, rashes/fevers/weakness.  He specifically requests a cholesterol test "just in case"    Objective:  Physical Exam: BP 112/78   Pulse 63   Temp 98.2 F (36.8 C) (Oral)   Ht 6\' 3"  (1.905 m)   Wt 225 lb 12.8 oz (102.4 kg)   SpO2 95%   BMI 28.22 kg/m   Gen: NAD, resting comfortably CV: RRR with no murmurs appreciated Pulm: NWOB, CTAB with no crackles, wheezes, or rhonchi GI: Normal bowel sounds present. Soft, Nontender, Nondistended. MSK: no edema, cyanosis, or clubbing noted Skin: warm, dry Neuro: grossly normal, moves all extremities Psych: Normal affect and thought content  Results for orders placed or performed in visit on 01/05/18 (from the past 72 hour(s))  HgB A1c     Status: Abnormal   Collection Time: 01/05/18  3:20 PM  Result Value Ref Range   Hemoglobin A1C 5.9 (A) 4.0 - 5.6 %   HbA1c POC (<> result, manual entry)     HbA1c, POC (prediabetic range)     HbA1c, POC (controlled diabetic range)    Lipid Panel     Status: None   Collection Time: 01/05/18  3:28 PM  Result Value Ref Range   Cholesterol, Total 163 100 - 199 mg/dL   Triglycerides 92 0 - 149 mg/dL   HDL 48 >16 mg/dL   VLDL Cholesterol Cal 18 5 - 40 mg/dL   LDL Calculated 97 0 - 99 mg/dL   Chol/HDL Ratio 3.4 0.0 - 5.0 ratio    Comment:                                   T. Chol/HDL Ratio                                             Men  Women                               1/2 Avg.Risk  3.4    3.3                                   Avg.Risk  5.0    4.4                                2X Avg.Risk  9.6    7.1                                3X Avg.Risk 23.4   11.0       Assessment/Plan:  Annual physical exam ascvd risk 3.7%, no statin indicated  Hyperglycemia Some values >100 on recent labs, A1C checked and only 5.9.  Discussed risks with patient and  he is started on metformin with plans to recheck in 3 monhts  Prediabetes Some cbg values >100 on recent labs, A1C checked and only 5.9.  Discussed risks with patient and he is started on metformin with plans to recheck in 3 monhts  Need for immunization against influenza Patient consents to flu shot   Marthenia Rolling, DO FAMILY MEDICINE RESIDENT - PGY2 01/07/2018 2:09 PM

## 2018-01-06 LAB — LIPID PANEL
CHOLESTEROL TOTAL: 163 mg/dL (ref 100–199)
Chol/HDL Ratio: 3.4 ratio (ref 0.0–5.0)
HDL: 48 mg/dL (ref >39)
LDL Calculated: 97 mg/dL (ref 0–99)
Triglycerides: 92 mg/dL (ref 0–149)
VLDL CHOLESTEROL CAL: 18 mg/dL (ref 5–40)

## 2018-01-07 DIAGNOSIS — R739 Hyperglycemia, unspecified: Secondary | ICD-10-CM | POA: Insufficient documentation

## 2018-01-07 DIAGNOSIS — Z23 Encounter for immunization: Secondary | ICD-10-CM | POA: Insufficient documentation

## 2018-01-07 NOTE — Assessment & Plan Note (Signed)
Some cbg values >100 on recent labs, A1C checked and only 5.9.  Discussed risks with patient and he is started on metformin with plans to recheck in 3 monhts

## 2018-01-07 NOTE — Assessment & Plan Note (Signed)
Some values >100 on recent labs, A1C checked and only 5.9.  Discussed risks with patient and he is started on metformin with plans to recheck in 3 monhts

## 2018-01-07 NOTE — Assessment & Plan Note (Signed)
Patient consents to flu shot 

## 2018-01-07 NOTE — Assessment & Plan Note (Signed)
ascvd risk 3.7%, no statin indicated

## 2018-03-08 ENCOUNTER — Ambulatory Visit
Admission: RE | Admit: 2018-03-08 | Discharge: 2018-03-08 | Disposition: A | Discharge status: Home / Self Care | Payer: BLUE CROSS/BLUE SHIELD | Source: Ambulatory Visit | Attending: Family Medicine | Admitting: Family Medicine

## 2018-03-08 ENCOUNTER — Ambulatory Visit: Payer: BLUE CROSS/BLUE SHIELD | Admitting: *Deleted

## 2018-03-08 DIAGNOSIS — R7611 Nonspecific reaction to tuberculin skin test without active tuberculosis: Secondary | ICD-10-CM | POA: Diagnosis not present

## 2018-03-08 DIAGNOSIS — Z111 Encounter for screening for respiratory tuberculosis: Secondary | ICD-10-CM

## 2018-03-08 NOTE — Progress Notes (Signed)
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  Patient is an immigrant from [place], therefore he was likely inoculated against tuberculosis as a child.  He would not be a candidate for a TB skin test for this reason, this has been verified by Dr. Gwendolyn Grant.  Dr. Gwendolyn Grant gave verbal orders for chest xray.  Pt is agreeable to plan and to getting the chest xray.  He will go today for walkin chest xray.  Advised that we would call him with results and they he could likely pick up a copy tomorrow.  I have provided pt with a letter for his school, as he has class tomorrow. Fleeger, Maryjo Rochester, CMA

## 2018-03-09 NOTE — Progress Notes (Signed)
Agree with plan.  Await paperwork -- occasionally I've been able to get around the TB skin based based on what the paperwork says.  Await results of CXR.

## 2018-03-10 ENCOUNTER — Encounter: Payer: Self-pay | Admitting: Family Medicine

## 2018-03-10 ENCOUNTER — Ambulatory Visit (INDEPENDENT_AMBULATORY_CARE_PROVIDER_SITE_OTHER): Payer: BLUE CROSS/BLUE SHIELD | Admitting: Family Medicine

## 2018-03-10 ENCOUNTER — Other Ambulatory Visit: Payer: Self-pay

## 2018-03-10 VITALS — BP 106/68 | HR 61 | Temp 97.9°F | Ht 75.0 in | Wt 225.4 lb

## 2018-03-10 DIAGNOSIS — R7309 Other abnormal glucose: Secondary | ICD-10-CM | POA: Diagnosis not present

## 2018-03-10 DIAGNOSIS — Z0289 Encounter for other administrative examinations: Secondary | ICD-10-CM

## 2018-03-10 NOTE — Progress Notes (Signed)
Subjective:    Bradley Flores is a 48 y.o. male who presents to [facility] today for letter for school:  1.  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  He was required to have a TB skin test.  He is an immigrant from [place] and had BCG vaccination as a child.  Skin test is also positive in the past.  He came for nurse visit and was referred for a chest x-ray upon my order.  His chest x-ray is returned and is completely negative.  He has had no hemoptysis or pulmonary symptoms.  No cough.  Doing well without complaints.  2.  Elevated A1C.  Started on metformin in November.  Asking for recheck of A1C today.     ROS as above per HPI.    The following portions of the patient's history were reviewed and updated as appropriate: allergies, current medications, past medical history, family and social history, and problem list. Patient is a nonsmoker.    PMH reviewed.  Past Medical History:  Diagnosis Date  . Allergic rhinitis   . Allergy   . Asthma    Past Surgical History:  Procedure Laterality Date  . FRACTURE SURGERY      Medications reviewed. Current Outpatient Medications  Medication Sig Dispense Refill  . cyclobenzaprine (FLEXERIL) 10 MG tablet Take 1 tablet (10 mg total) by mouth 2 (two) times daily as needed for muscle spasms. 60 tablet 1  . ergocalciferol (DRISDOL) 50000 units capsule Take 1 capsule (50,000 Units total) by mouth once a week. (Patient not taking: Reported on 11/10/2017) 30 capsule 0  . ibuprofen (ADVIL,MOTRIN) 600 MG tablet Take 1 tablet (600 mg total) by mouth every 8 (eight) hours as needed. 30 tablet 3  . meloxicam (MOBIC) 7.5 MG tablet Take 1-2 tablets (7.5-15 mg total) by mouth daily. 30 tablet 2  . metFORMIN (GLUCOPHAGE) 500 MG tablet Take 1 tablet (500 mg total) by mouth 2 (two) times daily with a meal. 180 tablet 0   No current facility-administered medications for this visit.      Objective:   Physical Exam BP 106/68   Pulse 61   Temp  97.9 F (36.6 C) (Oral)   Ht 6\' 3"  (1.905 m)   Wt 225 lb 6.4 oz (102.2 kg)   SpO2 97%   BMI 28.17 kg/m  Gen:  Alert, cooperative patient who appears stated age in no acute distress.  Vital signs reviewed. HEENT: EOMI,  MMM Cardiac:  Regular rate and rhythm without murmur auscultated.  Good S1/S2. Pulm:  Clear to auscultation bilaterally with good air movement.  No wheezes or rales noted.    Imp/Plan: 1.  Letter: -I have completed a letter for the patient stating that his chest x-ray was negative. -He can take this in 4 school to fill his requirements.  2.  Elevated A1c: -He should return sometime in February or March for a recheck of his A1c.  He is currently taking metformin.

## 2018-03-10 NOTE — Patient Instructions (Signed)
Come back in a month or so for you A1C test.  This is to test your blood sugar.  It is an average of your blood sugars over 3 months.  We want it to be less than 6.5.   I wrote a letter for school for you.

## 2018-04-08 ENCOUNTER — Ambulatory Visit: Payer: BLUE CROSS/BLUE SHIELD | Admitting: Family Medicine

## 2018-09-20 ENCOUNTER — Ambulatory Visit (INDEPENDENT_AMBULATORY_CARE_PROVIDER_SITE_OTHER): Payer: BC Managed Care – PPO | Admitting: Family Medicine

## 2018-09-20 ENCOUNTER — Other Ambulatory Visit: Payer: Self-pay

## 2018-09-20 VITALS — BP 122/70 | HR 64

## 2018-09-20 DIAGNOSIS — R300 Dysuria: Secondary | ICD-10-CM | POA: Diagnosis not present

## 2018-09-20 DIAGNOSIS — R7303 Prediabetes: Secondary | ICD-10-CM

## 2018-09-20 LAB — POCT URINALYSIS DIP (MANUAL ENTRY)
Bilirubin, UA: NEGATIVE
Glucose, UA: NEGATIVE mg/dL
Ketones, POC UA: NEGATIVE mg/dL
Leukocytes, UA: NEGATIVE
Nitrite, UA: NEGATIVE
Protein Ur, POC: NEGATIVE mg/dL
Spec Grav, UA: 1.025 (ref 1.010–1.025)
Urobilinogen, UA: 0.2 E.U./dL
pH, UA: 5.5 (ref 5.0–8.0)

## 2018-09-20 LAB — POCT UA - MICROSCOPIC ONLY

## 2018-09-20 LAB — POCT GLYCOSYLATED HEMOGLOBIN (HGB A1C): Hemoglobin A1C: 5.8 % — AB (ref 4.0–5.6)

## 2018-09-20 MED ORDER — PHENAZOPYRIDINE HCL 95 MG PO TABS: 95.0000 mg | ORAL_TABLET | Freq: Three times a day (TID) | ORAL | 0 refills | Status: DC | PRN

## 2018-09-20 NOTE — Progress Notes (Signed)
a1c

## 2018-09-20 NOTE — Patient Instructions (Signed)
It was great meeting you today!  You have some small blood in your urine which I think is consistent with urethral inflammation.  Give you medication called Pyridium which should help with this.  The inflammation should go away on its own.  If your symptoms fail to resolve please let us know.

## 2018-09-23 DIAGNOSIS — R300 Dysuria: Secondary | ICD-10-CM | POA: Insufficient documentation

## 2018-09-23 NOTE — Progress Notes (Signed)
   HPI 48 year old male who presents for painful urination.  Patient states that is been going on about 6 days.  Says that just prior to symptoms started he was having sexual intercourse with his wife.  During the rest he felt severe sharp pain to his urethral area.  Since then he has had mild burning with urination.  He has no other symptoms.  He has not noted any other trauma to the area.  He has not seen any trauma to the area.  He states that his wife does have a new IUD.  He has experienced this before but the pain went away on its own after about a week.  Also states that he is worried he may have gotten a yeast infection.  His wife has frequent yeast infections due to diabetes.  CC: Urinary burning  ROS:   Review of Systems See HPI for ROS.   CC, SH/smoking status, and VS noted  Objective: BP 122/70   Pulse 64   SpO2 98%  Gen: Very pleasant 48 year old African-American male, no acute distress CV: RRR, no murmur Resp: CTAB, no wheezes, non-labored Neuro: Alert and oriented, Speech clear, No gross deficits GU: No overt signs of trauma to penile head, or superficially seen urethra. UA: Small blood seen on UA, no occasional RBCs seen by microscopy.   Assessment and plan:  Prediabetes A1c 5.8 from 5.9.  Very stable.  Very unlikely to be contributing to his urinary symptoms.  Dysuria History patient likely has mild aspect of urethral trauma secondary to sexual intercourse..  We will try Pyridium 95 mg, as needed 3 times daily.  Patient to follow-up in 1 week if not improved.   Orders Placed This Encounter  Procedures  . HgB A1c  . POCT urinalysis dipstick  . POCT UA - Microscopic Only    Meds ordered this encounter  Medications  . phenazopyridine (PYRIDIUM) 95 MG tablet    Sig: Take 1 tablet (95 mg total) by mouth 3 (three) times daily as needed for pain.    Dispense:  30 tablet    Refill:  0     Guadalupe Dawn MD PGY-3 Family Medicine Resident  09/23/2018 5:06  PM

## 2018-09-23 NOTE — Assessment & Plan Note (Signed)
A1c 5.8 from 5.9.  Very stable.  Very unlikely to be contributing to his urinary symptoms.

## 2018-09-23 NOTE — Assessment & Plan Note (Signed)
History patient likely has mild aspect of urethral trauma secondary to sexual intercourse..  We will try Pyridium 95 mg, as needed 3 times daily.  Patient to follow-up in 1 week if not improved.

## 2019-02-16 ENCOUNTER — Other Ambulatory Visit: Payer: Self-pay

## 2019-02-16 ENCOUNTER — Ambulatory Visit (INDEPENDENT_AMBULATORY_CARE_PROVIDER_SITE_OTHER): Payer: BC Managed Care – PPO | Admitting: Family Medicine

## 2019-02-16 ENCOUNTER — Encounter: Payer: Self-pay | Admitting: Family Medicine

## 2019-02-16 VITALS — BP 102/76 | HR 77 | Ht 75.0 in | Wt 229.0 lb

## 2019-02-16 DIAGNOSIS — Z0000 Encounter for general adult medical examination without abnormal findings: Secondary | ICD-10-CM | POA: Diagnosis not present

## 2019-02-16 DIAGNOSIS — Z23 Encounter for immunization: Secondary | ICD-10-CM

## 2019-02-16 DIAGNOSIS — M545 Low back pain, unspecified: Secondary | ICD-10-CM

## 2019-02-16 MED ORDER — CYCLOBENZAPRINE HCL 10 MG PO TABS: 10.0000 mg | ORAL_TABLET | Freq: Three times a day (TID) | ORAL | 0 refills | Status: AC | PRN

## 2019-02-16 MED ORDER — MELOXICAM 15 MG PO TABS: 15.0000 mg | ORAL_TABLET | Freq: Every day | ORAL | 0 refills | Status: AC

## 2019-02-16 NOTE — Patient Instructions (Signed)
It was a pleasure to see you today! Thank you for choosing [facility] for your primary care. [place] was seen for acute right-sided back pain. Come back to the clinic in 4 to 6 weeks if this does not improve.   Today we talked about your low back pain.  Luckily it does not appear that there is any impact from damage nerves.  You can continue to use the Mobic that I am prescribing as well as the Flexeril for the next week or so.  Usually this will resolve within 4 to 6 weeks on its own.  After discussing it with my colleague does not look like a back brace is likely to be helpful but core strengthening exercises like the one I am printing for you can often help because a lot of people actually have weak back muscles.   Please bring all your medications to every doctors visit   Sign up for My Chart to have easy access to your labs results, and communication with your Primary care physician.     Please check-out at the front desk before leaving the clinic.     Best,  Dr. Sherene Sires FAMILY MEDICINE RESIDENT - PGY3 02/16/2019 10:20 AM

## 2019-02-17 LAB — BASIC METABOLIC PANEL
BUN/Creatinine Ratio: 18 (ref 9–20)
BUN: 18 mg/dL (ref 6–24)
CO2: 21 mmol/L (ref 20–29)
Calcium: 9.6 mg/dL (ref 8.7–10.2)
Chloride: 101 mmol/L (ref 96–106)
Creatinine, Ser: 0.98 mg/dL (ref 0.76–1.27)
GFR calc Af Amer: 103 mL/min/{1.73_m2} (ref >59)
GFR calc non Af Amer: 90 mL/min/{1.73_m2} (ref >59)
Glucose: 93 mg/dL (ref 65–99)
Potassium: 4.7 mmol/L (ref 3.5–5.2)
Sodium: 139 mmol/L (ref 134–144)

## 2019-02-17 LAB — LIPID PANEL
Chol/HDL Ratio: 3.8 ratio (ref 0.0–5.0)
Cholesterol, Total: 181 mg/dL (ref 100–199)
HDL: 48 mg/dL (ref >39)
LDL Chol Calc (NIH): 107 mg/dL — ABNORMAL HIGH (ref 0–99)
Triglycerides: 149 mg/dL (ref 0–149)
VLDL Cholesterol Cal: 26 mg/dL (ref 5–40)

## 2019-02-17 LAB — CBC
Hematocrit: 47.9 % (ref 37.5–51.0)
Hemoglobin: 15.8 g/dL (ref 13.0–17.7)
MCH: 28.7 pg (ref 26.6–33.0)
MCHC: 33 g/dL (ref 31.5–35.7)
MCV: 87 fL (ref 79–97)
Platelets: 189 10*3/uL (ref 150–450)
RBC: 5.51 x10E6/uL (ref 4.14–5.80)
RDW: 12.7 % (ref 11.6–15.4)
WBC: 5.1 10*3/uL (ref 3.4–10.8)

## 2019-02-19 DIAGNOSIS — M545 Low back pain, unspecified: Secondary | ICD-10-CM | POA: Insufficient documentation

## 2019-02-19 NOTE — Assessment & Plan Note (Addendum)
Patient states that his work requires him to get annual labs including CBC, BMP, lipid panel.  Results are reviewed with a CVD risk calculator and no intervention required.

## 2019-02-19 NOTE — Assessment & Plan Note (Signed)
Patient consents to flu shot 

## 2019-02-19 NOTE — Assessment & Plan Note (Signed)
No red flag symptoms, will try conservative home therapy for core strengthening, patient does have Flexeril and Mobic.  Emergency precautions and return discussed.

## 2019-02-19 NOTE — Progress Notes (Signed)
Subjective:  Bradley Flores is a 48 y.o. male who presents to the [facility] today with a chief complaint of annual physical for work.   HPI: Patient is here for an annual physical for work, he has no physical complaints other than mild occasionally recurring back pain.  He works in a [employer] and will often have episodes of a few weeks at a time of back pain.  He started having another episode a few days ago which is  more on the right side.  There is no radiation with this and it is in the lumbar area of his back.  He has had no neurological symptoms or incontinence.  He has had no weakness in his legs and has been taking his chronic Mobic and Flexeril for this.  He is still able to work  Objective:  Physical Exam: BP 102/76   Pulse 77   Ht 6\' 3"  (1.905 m)   Wt 229 lb (103.9 kg)   SpO2 96%   BMI 28.62 kg/m   Gen: NAD, pleasant  CV: RRR with no murmurs appreciated Pulm: NWOB, CTAB with no crackles, wheezes, or rhonchi GI: Normal bowel sounds present. Soft, Nontender, Nondistended. MSK: Good flexion and extension with no eliciting of red flag symptoms, no obvious deformation, no spinal midline tenderness, no bruising to the skin.  Negative straight leg Skin: warm, dry Neuro: grossly normal, moves all extremities Psych: Normal affect and thought content  Results for orders placed or performed in visit on 02/16/19 (from the past 72 hour(s))  CBC     Status: None   Collection Time: 02/16/19 11:26 AM  Result Value Ref Range   WBC 5.1 3.4 - 10.8 x10E3/uL   RBC 5.51 4.14 - 5.80 x10E6/uL   Hemoglobin 15.8 13.0 - 17.7 g/dL   Hematocrit 02/18/19 59.7 - 51.0 %   MCV 87 79 - 97 fL   MCH 28.7 26.6 - 33.0 pg   MCHC 33.0 31.5 - 35.7 g/dL   RDW 41.6 38.4 - 53.6 %   Platelets 189 150 - 450 x10E3/uL  Basic Metabolic Panel     Status: None   Collection Time: 02/16/19 11:26 AM  Result Value Ref Range   Glucose 93 65 - 99 mg/dL   BUN 18 6 - 24 mg/dL   Creatinine, Ser 02/18/19 0.76 - 1.27 mg/dL     GFR calc non Af Amer 90 >59 mL/min/1.73   GFR calc Af Amer 103 >59 mL/min/1.73   BUN/Creatinine Ratio 18 9 - 20   Sodium 139 134 - 144 mmol/L   Potassium 4.7 3.5 - 5.2 mmol/L   Chloride 101 96 - 106 mmol/L   CO2 21 20 - 29 mmol/L   Calcium 9.6 8.7 - 10.2 mg/dL  Lipid Panel     Status: Abnormal   Collection Time: 02/16/19 11:26 AM  Result Value Ref Range   Cholesterol, Total 181 100 - 199 mg/dL   Triglycerides 02/18/19 0 - 149 mg/dL   HDL 48 122 mg/dL   VLDL Cholesterol Cal 26 5 - 40 mg/dL   LDL Chol Calc (NIH) >48 (H) 0 - 99 mg/dL   Chol/HDL Ratio 3.8 0.0 - 5.0 ratio    Comment:                                   T. Chol/HDL Ratio  Men  Women                               1/2 Avg.Risk  3.4    3.3                                   Avg.Risk  5.0    4.4                                2X Avg.Risk  9.6    7.1                                3X Avg.Risk 23.4   11.0      Assessment/Plan:  Acute right-sided low back pain without sciatica No red flag symptoms, will try conservative home therapy for core strengthening, patient does have Flexeril and Mobic.  Emergency precautions and return discussed.  Need for immunization against influenza Patient consents to flu shot  Annual physical exam Patient states that his work requires him to get annual labs including CBC, BMP, lipid panel.  Results are reviewed and no intervention required.   Sherene Sires, DO FAMILY MEDICINE RESIDENT - PGY3 02/19/2019 7:41 AM

## 2019-03-31 DIAGNOSIS — Z1152 Encounter for screening for COVID-19: Secondary | ICD-10-CM | POA: Diagnosis not present

## 2019-08-22 ENCOUNTER — Ambulatory Visit (INDEPENDENT_AMBULATORY_CARE_PROVIDER_SITE_OTHER): Payer: BC Managed Care – PPO | Admitting: Family Medicine

## 2019-08-22 ENCOUNTER — Other Ambulatory Visit: Payer: Self-pay

## 2019-08-22 VITALS — BP 112/72 | HR 66 | Ht 75.0 in | Wt 223.6 lb

## 2019-08-22 DIAGNOSIS — M542 Cervicalgia: Secondary | ICD-10-CM

## 2019-08-22 MED ORDER — MELOXICAM 15 MG PO TABS: 15.0000 mg | ORAL_TABLET | Freq: Every day | ORAL | 0 refills | Status: DC

## 2019-08-22 NOTE — Progress Notes (Signed)
    SUBJECTIVE:   CHIEF COMPLAINT / HPI: "neck pain"   Bradley Flores is a 49 yo male presenting for evaluation of acute neck pain.  Reports that is been present for the past 3-4 weeks, insidiously came on, worse in the past week.  Predominantly right-sided, worse with rotating his neck to the right, neck extension, and side bending to both sides.  He has been hearing a clunk sound with certain movements and this worried him, prompting him to seek evaluation.  Denies any preceding trauma or accident.  Has not had anything like this before.  Has used ibuprofen about 2-3 times since onset and has tried Voltaren gel 1-2 times.  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  He denies any associated numbness/tingling, weakness, headache, visual changes, fever, unexpected weight loss.   PERTINENT  PMH / PSH: pre-diabetes, allergic rhinitis   OBJECTIVE:   BP 112/72   Pulse 66   Ht 6\' 3"  (1.905 m)   Wt 223 lb 9.6 oz (101.4 kg)   SpO2 98%   BMI 27.95 kg/m   General: Alert, NAD, comfortable sitting up  HEENT: NCAT, MMM Lungs: No increased WOB  Msk: No deformities, step offs, or rash along the cervical spine. No contractures. Tender with palpation to the right sternocleidomastoid muscle and paraspinal musculature.  Reduced ROM with cervical right-sided rotation and extension to approximately 60 degrees.  Pain elicited with with right rotation, extension, and bilateral sidebending.  5/5 upper extremity strength bilaterally.  Sensation to light touch intact bilaterally through fingertips.  Negative Lhermitte's and Spurling's test bilaterally. Ext: Warm, dry, 2+ radial pulses  ASSESSMENT/PLAN:   Cervical pain (neck) Acute, worsening.  Clinical history and presentation most suggestive of discogenic cervical pain associated with tense paraspinal and right sided SCM musculature given reduced ROM without radicular symptoms.  Physically demanding occupation, no red  flags exam/history.  Rx'd meloxicam x 10 days, Voltaren gel/Tylenol/heat PRN, and neck exercises when flare decreasing.  RTC if not improving in the next 3-4 weeks or sooner if radicular sx/increased severity, could consider XR and PT at that time.    , DO [facility]

## 2019-08-22 NOTE — Assessment & Plan Note (Addendum)
Acute, worsening.  Clinical history and presentation most suggestive of discogenic cervical pain associated with tense paraspinal and right sided SCM musculature given reduced ROM without radicular symptoms.  Physically demanding occupation, no red flags exam/history.  Rx'd meloxicam x 10 days, Voltaren gel/Tylenol/heat PRN, and neck exercises when flare decreasing.  RTC if not improving in the next 3-4 weeks or sooner if radicular sx/increased severity, could consider XR and PT at that time.

## 2019-08-22 NOTE — Patient Instructions (Signed)
It was wonderful seeing you today! You were seen for neck pain that likely is muscular and disc pain related.  I have sent in an anti-inflammatory medication for you to take once daily. DO not take with ibuprofen, motrin, or aleve. You can also take tylenol throughout the day if needed. Can continue to use voltaren gel 4 times daily as needed. Heat on region 20 minutes at a time several times a day. Follow up if not better in the next 3-4 weeks or sooner if worsening including any numbness, tingling, weakness, change in vision, or becoming so severe it is not managed with pain medication provided.     Neck Exercises Ask your health care provider which exercises are safe for you. Do exercises exactly as told by your health care provider and adjust them as directed. It is normal to feel mild stretching, pulling, tightness, or discomfort as you do these exercises. Stop right away if you feel sudden pain or your pain gets worse. Do not begin these exercises until told by your health care provider. Neck exercises can be important for many reasons. They can improve strength and maintain flexibility in your neck, which will help your upper back and prevent neck pain. Stretching exercises Rotation neck stretching  1. Sit in a chair or stand up. 2. Place your feet flat on the floor, shoulder width apart. 3. Slowly turn your head (rotate) to the right until a slight stretch is felt. Turn it all the way to the right so you can look over your right shoulder. Do not tilt or tip your head. 4. Hold this position for 10-30 seconds. 5. Slowly turn your head (rotate) to the left until a slight stretch is felt. Turn it all the way to the left so you can look over your left shoulder. Do not tilt or tip your head. 6. Hold this position for 10-30 seconds. Repeat __________ times. Complete this exercise __________ times a day. Neck retraction 1. Sit in a sturdy chair or stand up. 2. Look straight ahead. Do not bend  your neck. 3. Use your fingers to push your chin backward (retraction). Do not bend your neck for this movement. Continue to face straight ahead. If you are doing the exercise properly, you will feel a slight sensation in your throat and a stretch at the back of your neck. 4. Hold the stretch for 1-2 seconds. Repeat __________ times. Complete this exercise __________ times a day. Strengthening exercises Neck press 1. Lie on your back on a firm bed or on the floor with a pillow under your head. 2. Use your neck muscles to push your head down on the pillow and straighten your spine. 3. Hold the position as well as you can. Keep your head facing up (in a neutral position) and your chin tucked. 4. Slowly count to 5 while holding this position. Repeat __________ times. Complete this exercise __________ times a day. Isometrics These are exercises in which you strengthen the muscles in your neck while keeping your neck still (isometrics). 1. Sit in a supportive chair and place your hand on your forehead. 2. Keep your head and face facing straight ahead. Do not flex or extend your neck while doing isometrics. 3. Push forward with your head and neck while pushing back with your hand. Hold for 10 seconds. 4. Do the sequence again, this time putting your hand against the back of your head. Use your head and neck to push backward against the hand pressure. 5.  Finally, do the same exercise on either side of your head, pushing sideways against the pressure of your hand. Repeat __________ times. Complete this exercise __________ times a day. Prone head lifts 1. Lie face-down (prone position), resting on your elbows so that your chest and upper back are raised. 2. Start with your head facing downward, near your chest. Position your chin either on or near your chest. 3. Slowly lift your head upward. Lift until you are looking straight ahead. Then continue lifting your head as far back as you can comfortably  stretch. 4. Hold your head up for 5 seconds. Then slowly lower it to your starting position. Repeat __________ times. Complete this exercise __________ times a day. Supine head lifts 1. Lie on your back (supine position), bending your knees to point to the ceiling and keeping your feet flat on the floor. 2. Lift your head slowly off the floor, raising your chin toward your chest. 3. Hold for 5 seconds. Repeat __________ times. Complete this exercise __________ times a day. Scapular retraction 1. Stand with your arms at your sides. Look straight ahead. 2. Slowly pull both shoulders (scapulae) backward and downward (retraction) until you feel a stretch between your shoulder blades in your upper back. 3. Hold for 10-30 seconds. 4. Relax and repeat. Repeat __________ times. Complete this exercise __________ times a day. Contact a health care provider if:  Your neck pain or discomfort gets much worse when you do an exercise.  Your neck pain or discomfort does not improve within 2 hours after you exercise. If you have any of these problems, stop exercising right away. Do not do the exercises again unless your health care provider says that you can. Get help right away if:  You develop sudden, severe neck pain. If this happens, stop exercising right away. Do not do the exercises again unless your health care provider says that you can. This information is not intended to replace advice given to you by your health care provider. Make sure you discuss any questions you have with your health care provider. Document Revised: 12/16/2017 Document Reviewed: 12/16/2017 Elsevier Patient Education  2020 [employer].

## 2019-09-08 ENCOUNTER — Other Ambulatory Visit: Payer: Self-pay

## 2019-09-08 ENCOUNTER — Ambulatory Visit (INDEPENDENT_AMBULATORY_CARE_PROVIDER_SITE_OTHER): Payer: BC Managed Care – PPO | Admitting: Family Medicine

## 2019-09-08 VITALS — BP 100/62 | HR 68 | Ht 75.0 in | Wt 222.0 lb

## 2019-09-08 DIAGNOSIS — M62838 Other muscle spasm: Secondary | ICD-10-CM | POA: Diagnosis not present

## 2019-09-08 DIAGNOSIS — M542 Cervicalgia: Secondary | ICD-10-CM

## 2019-09-08 MED ORDER — CYCLOBENZAPRINE HCL 10 MG PO TABS: 10.0000 mg | ORAL_TABLET | Freq: Every day | ORAL | 0 refills | Status: AC

## 2019-09-08 MED ORDER — MELOXICAM 15 MG PO TABS: 15.0000 mg | ORAL_TABLET | Freq: Every day | ORAL | 0 refills | Status: DC

## 2019-09-08 NOTE — Patient Instructions (Signed)
It was great to meet you!  Our plans for today:  - I have placed a referral for physical therapy. They will call you to schedule an appointment. - I have refilled the prescription for Meloxicam. Take one pill daily as needed. - I have given you a prescription for a muscle relaxant (Flexeril). Take one pill at bedtime.  If you develop worsening symptoms such as arm weakness please call us.  I hope you feel better!  Dr. [facility]

## 2019-09-08 NOTE — Progress Notes (Signed)
    SUBJECTIVE:   CHIEF COMPLAINT / HPI:   Neck Pain Patient reports right sided neck pain that began 5 weeks ago. The onset was gradual, with no inciting injury. He describes the pain as a tightness, rated 6/10, with intermittent muscle spasms and an audible crunching with certain movements. The pain is typically worse at night. Two days ago he had one episode of tingling in his right hand, which resolved spontaneously after a few hours. Denies any weakness, numbness, or systemic symptoms. Patient was seen here 2 weeks ago, at which time he was given Meloxicam 15mg  and Voltaren gel, which provide minimal improvement. 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 No red flags such as hx of cancer, IV drug use, weight loss, etc.   PERTINENT  PMH / PSH: prediabetes, sciatica  OBJECTIVE:   BP 100/62   Pulse 68   Ht 6\' 3"  (1.905 m)   Wt 222 lb (100.7 kg)   SpO2 97%   BMI 27.75 kg/m   General: alert, well-appearing, NAD Neck: No deformities or step-offs. No bony tenderness. Tender to palpation of right paracervical muscles and trapezius. Increased muscle tightness on right. Slightly limited extension, rightward rotation, and rightward head bending secondary to pain. 5/5 strength in bilateral upper extremities. Sensation intact. 2+ biceps reflex. Cardiac: RRR, normal S1/S2, no murmurs, rubs or gallops Lungs: Normal work of breathing, breath sounds equal bilaterally, lungs CTA without wheezes or rales  ASSESSMENT/PLAN:   Cervical pain (neck) Suspect muscular etiology given lack of red flags on history, and exam consistent with paraspinal muscle tenderness and tightness. History of excessive turning and lifting at work also suggestive of muscular etiology. No radicular symptoms or bony tenderness so less likely bony or nerve abnormality.  Plan: -Refilled Rx for Meloxicam 15mg  daily x2 weeks -Given Rx for Flexeril 10mg   daily at bedtime as needed -Given referral for physical therapy -Return precautions discussed -If not improved in ~6 weeks, will consider imaging     , MD [facility]

## 2019-09-08 NOTE — Assessment & Plan Note (Addendum)
Suspect muscular etiology given lack of red flags on history, and exam consistent with paraspinal muscle tenderness and tightness. History of excessive turning and lifting at work also suggestive of muscular etiology. No radicular symptoms or bony tenderness so less likely bony or nerve abnormality.  Plan: -Refilled Rx for Meloxicam 15mg  daily x2 weeks -Given Rx for Flexeril 10mg  daily at bedtime as needed -Given referral for physical therapy -Return precautions discussed -If not improved in ~6 weeks, will consider imaging

## 2020-02-29 ENCOUNTER — Encounter: Payer: BC Managed Care – PPO | Admitting: Family Medicine

## 2020-03-01 ENCOUNTER — Ambulatory Visit (INDEPENDENT_AMBULATORY_CARE_PROVIDER_SITE_OTHER): Payer: BC Managed Care – PPO | Admitting: Family Medicine

## 2020-03-01 ENCOUNTER — Other Ambulatory Visit: Payer: Self-pay

## 2020-03-01 ENCOUNTER — Encounter: Payer: Self-pay | Admitting: Family Medicine

## 2020-03-01 VITALS — BP 112/80 | HR 69 | Wt 222.2 lb

## 2020-03-01 DIAGNOSIS — Z1159 Encounter for screening for other viral diseases: Secondary | ICD-10-CM

## 2020-03-01 DIAGNOSIS — R7303 Prediabetes: Secondary | ICD-10-CM

## 2020-03-01 DIAGNOSIS — Z13220 Encounter for screening for lipoid disorders: Secondary | ICD-10-CM | POA: Diagnosis not present

## 2020-03-01 DIAGNOSIS — Z1211 Encounter for screening for malignant neoplasm of colon: Secondary | ICD-10-CM

## 2020-03-01 NOTE — Patient Instructions (Signed)
It was great to see you! Thank you for allowing me to participate in your care!  Our plans for today:  -We are checking blood work today for your annual physical and also as requested for your employer.  I will call you with the results of this blood work in the next few days and will also send these to you in a letter.  If your employer has any difficulty accessing these you will also have a copy of these in the letter that you can show them. -I have also sent a referral for a colonoscopy.  The gastroenterologist office will reach out to you in the next 2 weeks to schedule this for the future.  If you do not hear from them in 2 weeks please let me know.  We are checking some labs today, I will call you if they are abnormal will send you a MyChart message or a letter if they are normal.  If you do not hear about your labs in the next 2 weeks please let us know.  Take care and seek immediate care sooner if you develop any concerns.   Dr. Jackelyn Poling, DO [facility]

## 2020-03-01 NOTE — Progress Notes (Signed)
    SUBJECTIVE:   CHIEF COMPLAINT / HPI:   Annual physical: Patient is a 49 year old male who presents today for his annual physical.  He states that he gets a discount on his health insurance if he has certain blood test performed each year for his work which include an A1c, cholesterol panel, etc. Current medical problems include: Prediabetes- previously on Metformin 500 mg twice daily but has not taken it in about a year. He presents because his work recommends yearly bloodwork including A1C, Cholesterol, etc  Current medications: None.  PERTINENT  PMH / PSH: Prediabetes  OBJECTIVE:   BP 112/80   Pulse 69   Wt 222 lb 4 oz (100.8 kg)   SpO2 96%   BMI 27.78 kg/m    General: NAD, pleasant, able to participate in exam Cardiac: RRR, no murmurs. Respiratory: CTAB, normal effort, No wheezes, rales or rhonchi Abdomen: Bowel sounds present, nontender, nondistended Extremities: no edema or cyanosis. Skin: warm and dry, no rashes noted Neuro: alert, no obvious focal deficits Psych: Normal affect and mood  ASSESSMENT/PLAN:   Annual physical: Assessment: 49 year old male presenting for an annual physical at the request of his work.  The patient receives a discount on his health insurance if he has certain blood testing performed each year and submits it to his work.  He has no complaints today. Plan:-We will check cholesterol panel, -We will check A1c -We will screen for hepatitis C -Patient is at the recommended age to have a colonoscopy and has never had 1.  Discussed this with the patient and will send amble for referral for GI for colonoscopy.  Jackelyn Poling, DO [facility]    This note was prepared using Dragon voice recognition software and may include unintentional dictation errors due to the inherent limitations of voice recognition software.

## 2020-03-02 LAB — HEMOGLOBIN A1C
Est. average glucose Bld gHb Est-mCnc: 140 mg/dL
Hgb A1c MFr Bld: 6.5 % — ABNORMAL HIGH (ref 4.8–5.6)

## 2020-03-02 LAB — LIPID PANEL
Chol/HDL Ratio: 3.3 ratio (ref 0.0–5.0)
Cholesterol, Total: 145 mg/dL (ref 100–199)
HDL: 44 mg/dL (ref >39)
LDL Chol Calc (NIH): 85 mg/dL (ref 0–99)
Triglycerides: 84 mg/dL (ref 0–149)
VLDL Cholesterol Cal: 16 mg/dL (ref 5–40)

## 2020-03-02 LAB — HEPATITIS C ANTIBODY: Hep C Virus Ab: <0.1 {s_co_ratio} (ref 0.0–0.9)

## 2020-03-05 ENCOUNTER — Other Ambulatory Visit: Payer: BC Managed Care – PPO

## 2020-03-05 ENCOUNTER — Other Ambulatory Visit: Payer: Self-pay

## 2020-03-05 DIAGNOSIS — Z20822 Contact with and (suspected) exposure to covid-19: Secondary | ICD-10-CM

## 2020-03-06 ENCOUNTER — Telehealth: Payer: Self-pay | Admitting: *Deleted

## 2020-03-06 NOTE — Telephone Encounter (Signed)
Patient's wife Fonnie Birkenhead notified that COVID results are still pending at this time. Understanding verbalized.

## 2020-03-07 ENCOUNTER — Telehealth: Payer: Self-pay | Admitting: *Deleted

## 2020-03-07 LAB — SARS-COV-2, NAA 2 DAY TAT

## 2020-03-07 LAB — NOVEL CORONAVIRUS, NAA: SARS-CoV-2, NAA: NOT DETECTED

## 2020-03-07 NOTE — Telephone Encounter (Signed)
Pt notified of negative COVID-19 results. Understanding verbalized.   

## 2020-05-07 ENCOUNTER — Other Ambulatory Visit: Payer: Self-pay

## 2020-05-07 ENCOUNTER — Ambulatory Visit (AMBULATORY_SURGERY_CENTER): Payer: BC Managed Care – PPO | Admitting: *Deleted

## 2020-05-07 ENCOUNTER — Encounter: Payer: Self-pay | Admitting: Internal Medicine

## 2020-05-07 VITALS — Ht 75.0 in | Wt 224.0 lb

## 2020-05-07 DIAGNOSIS — Z1211 Encounter for screening for malignant neoplasm of colon: Secondary | ICD-10-CM

## 2020-05-07 NOTE — Progress Notes (Signed)

## 2020-05-21 ENCOUNTER — Telehealth: Payer: Self-pay | Admitting: Internal Medicine

## 2020-05-21 ENCOUNTER — Encounter: Payer: BC Managed Care – PPO | Admitting: Internal Medicine

## 2020-05-21 NOTE — Telephone Encounter (Signed)
NO charge.  

## 2020-08-07 ENCOUNTER — Encounter: Payer: Self-pay | Admitting: Family Medicine

## 2020-08-07 ENCOUNTER — Other Ambulatory Visit: Payer: Self-pay

## 2020-08-07 ENCOUNTER — Ambulatory Visit (INDEPENDENT_AMBULATORY_CARE_PROVIDER_SITE_OTHER): Payer: BC Managed Care – PPO | Admitting: Family Medicine

## 2020-08-07 VITALS — BP 112/60 | HR 81 | Ht 75.0 in | Wt 212.5 lb

## 2020-08-07 DIAGNOSIS — J4520 Mild intermittent asthma, uncomplicated: Secondary | ICD-10-CM | POA: Insufficient documentation

## 2020-08-07 DIAGNOSIS — R7303 Prediabetes: Secondary | ICD-10-CM

## 2020-08-07 LAB — POCT GLYCOSYLATED HEMOGLOBIN (HGB A1C): HbA1c, POC (controlled diabetic range): 6 % (ref 0.0–7.0)

## 2020-08-07 MED ORDER — METFORMIN HCL ER 500 MG PO TB24: 500.0000 mg | ORAL_TABLET | Freq: Every day | ORAL | 1 refills | Status: DC

## 2020-08-07 MED ORDER — ALBUTEROL SULFATE HFA 108 (90 BASE) MCG/ACT IN AERS: 2.0000 | INHALATION_SPRAY | Freq: Four times a day (QID) | RESPIRATORY_TRACT | 1 refills | Status: DC | PRN

## 2020-08-07 NOTE — Assessment & Plan Note (Signed)
-   Albuterol refilled.

## 2020-08-07 NOTE — Patient Instructions (Addendum)
Thank you for coming to see me today. It was a pleasure. Today we talked about:   We will get some labs today.  If they are abnormal or we need to do something about them, I will call you.  If they are normal, I will send you a message on MyChart (if it is active) or a letter in the mail.  If you don't hear from [place] in 2 weeks, please call the office at the number below.  Call to scheduled your colonoscopy again.  Start metformin 500mg  once a day.  Please follow-up with new PCP in 1 month.  If you have any questions or concerns, please do not hesitate to call the office at [phone].  Best,   [phone], DO

## 2020-08-07 NOTE — Progress Notes (Signed)
    SUBJECTIVE:   CHIEF COMPLAINT / HPI:   Prediabetes Last seen on 03/01/2020, A1c increased from 5.8-6.5 at that time Last lipid panel on 03/01/2020 as well, LDL 85 at that time Last BMP in 2020, WNL He states that he did not change his diet  He took metformin about 8-12 months ago, but hasn't recently He exercises  Hx Asthma Wants refill on albuterol to use with allergic asthma  Colonoscopy had to be rescheduled COVID booster, does not one  Shingrix needed, counseled  PERTINENT  PMH / PSH: Prediabetes, allergic rhinitis  OBJECTIVE:   BP 112/60   Pulse 81   Ht 6\' 3"  (1.905 m)   Wt 212 lb 8 oz (96.4 kg)   SpO2 97%   BMI 26.56 kg/m    Physical Exam:  General: 50 y.o. male in NAD Cardio: RRR no m/r/g Lungs: CTAB, no wheezing, no rhonchi, no crackles, no IWOB on RA Skin: warm and dry Extremities: No edema  Results for orders placed or performed in visit on 08/07/20 (from the past 24 hour(s))  HgB A1c     Status: None   Collection Time: 08/07/20  2:34 PM  Result Value Ref Range   Hemoglobin A1C     HbA1c POC (<> result, manual entry)     HbA1c, POC (prediabetic range)     HbA1c, POC (controlled diabetic range) 6.0 0.0 - 7.0 %       ASSESSMENT/PLAN:   Prediabetes A1c improving.  Patient encouraged to follow healthy diet and exercise, but he states that he would like to restart metformin for her mention of further progression.  Did discuss with him that this is not required, but he is adamant that he would like to restart it.  We will obtain a BMP as well today.  Have him follow-up in 1 month and will need a repeat BMP at that time.  Recommend follow-up A1c in 3 months with start of metformin.  Mild intermittent asthma without complication Albuterol refilled.   Encouraged him to reschedule colonoscopy.  He declines COVID booster.  Counseled on importance of Shingrix vaccination, he will contact pharmacy if he would like this.  10/07/20, DO [facility]

## 2020-08-07 NOTE — Assessment & Plan Note (Signed)
A1c improving.  Patient encouraged to follow healthy diet and exercise, but he states that he would like to restart metformin for her mention of further progression.  Did discuss with him that this is not required, but he is adamant that he would like to restart it.  We will obtain a BMP as well today.  Have him follow-up in 1 month and will need a repeat BMP at that time.  Recommend follow-up A1c in 3 months with start of metformin.

## 2020-08-08 LAB — BASIC METABOLIC PANEL
BUN/Creatinine Ratio: 19 (ref 9–20)
BUN: 17 mg/dL (ref 6–24)
CO2: 23 mmol/L (ref 20–29)
Calcium: 9.3 mg/dL (ref 8.7–10.2)
Chloride: 104 mmol/L (ref 96–106)
Creatinine, Ser: 0.9 mg/dL (ref 0.76–1.27)
Glucose: 152 mg/dL — ABNORMAL HIGH (ref 65–99)
Potassium: 4.1 mmol/L (ref 3.5–5.2)
Sodium: 141 mmol/L (ref 134–144)
eGFR: 103 mL/min/{1.73_m2} (ref >59)

## 2020-09-12 ENCOUNTER — Other Ambulatory Visit: Payer: Self-pay

## 2020-09-12 ENCOUNTER — Encounter: Payer: Self-pay | Admitting: Family Medicine

## 2020-09-12 ENCOUNTER — Ambulatory Visit (INDEPENDENT_AMBULATORY_CARE_PROVIDER_SITE_OTHER): Payer: BC Managed Care – PPO | Admitting: Family Medicine

## 2020-09-12 VITALS — BP 119/70 | HR 77 | Wt 214.6 lb

## 2020-09-12 DIAGNOSIS — R7303 Prediabetes: Secondary | ICD-10-CM | POA: Diagnosis not present

## 2020-09-12 DIAGNOSIS — E119 Type 2 diabetes mellitus without complications: Secondary | ICD-10-CM | POA: Diagnosis not present

## 2020-09-12 MED ORDER — METFORMIN HCL ER 500 MG PO TB24: 500.0000 mg | ORAL_TABLET | Freq: Every day | ORAL | 3 refills | Status: DC

## 2020-09-12 NOTE — Progress Notes (Signed)
    SUBJECTIVE:   CHIEF COMPLAINT / HPI:   Diabetes follow-up Patient reports today for a diabetes follow-up.  He was recently seen and evaluated and started on metformin.  He was brought back today to ensure he is having good compliance as well as good tolerance of the metformin.  He reports that he has been doing well and no medication.  He has been taking it daily without difficulty.  Denies any GI symptoms such as nausea, vomiting, diarrhea, constipation.  Has no other complaints or concerns at this time.  Denies any signs or symptoms of hypoglycemia.  We will continue taking this medication.  OBJECTIVE:   BP 119/70   Pulse 77   Wt 214 lb 9.6 oz (97.3 kg)   SpO2 98%   BMI 26.82 kg/m   General: Well-appearing 50 year old male, no acute distress Cardiac: Regular rate and rhythm, no murmurs appreciated Respiratory: Normal work of breathing, lungs clear to auscultation bilaterally Abdomen: Soft, nontender, positive bowel sounds MSK: No gross abnormalities  ASSESSMENT/PLAN:   Diabetes mellitus without complication (HCC) Patient previously had prediabetes.  In December he had an hemoglobin A1c of 6.5 pushing him into the diabetic range.  Repeat hemoglobin A1c was 6.0.  He was started on metformin 500 mg daily.  He has tolerated this medication well.  He will continue the medication and we will follow-up in approximately 3 months for repeat hemoglobin A1c.  He will continue to make dietary changes.  Strict return precautions given.     Derrel Nip, MD [facility]

## 2020-09-12 NOTE — Patient Instructions (Signed)
It was wonderful seeing you today.  I am glad you are tolerating the medication well.  I want you to continue taking metformin 500 mg daily.  Regarding your colonoscopy please call GI to reschedule your colonoscopy.  If you have any questions or concerns please feel free to call the clinic.  I hope you have a wonderful afternoon!

## 2020-09-13 DIAGNOSIS — E119 Type 2 diabetes mellitus without complications: Secondary | ICD-10-CM | POA: Insufficient documentation

## 2020-09-13 NOTE — Assessment & Plan Note (Signed)
Patient previously had prediabetes.  In December he had an hemoglobin A1c of 6.5 pushing him into the diabetic range.  Repeat hemoglobin A1c was 6.0.  He was started on metformin 500 mg daily.  He has tolerated this medication well.  He will continue the medication and we will follow-up in approximately 3 months for repeat hemoglobin A1c.  He will continue to make dietary changes.  Strict return precautions given.

## 2020-12-20 ENCOUNTER — Ambulatory Visit: Payer: BC Managed Care – PPO

## 2020-12-20 ENCOUNTER — Other Ambulatory Visit: Payer: Self-pay

## 2021-01-09 NOTE — Progress Notes (Signed)
    SUBJECTIVE:   CHIEF COMPLAINT / HPI: knee pain and covid booster  Knee Pain  Patient reports that he has been experiencing greater than 4 months of right knee pain that is sometimes associated with swelling.  He states that he has tried NSAIDs and wrapping the knee which helps minimally with his pain.  He reports that the pain is 8/10 in severity and improves to 5/10 with the above-mentioned therapies.  He states that he often has to compensate with his left knee when climbing stairs.  He states that he has a difficult time kneeling to pray due to the right knee pain.  He reports that he often notices swelling in the right knee and sometimes hears clicking noises.  He reports that in the past he played soccer and landed regularly but took a break for about 7 years.  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  He denies any weakness or unsteadiness.  Patient states that he previously had steroid injections to his knee which he believes helped but asked that this procedure was completed a long time ago.  Patient would like to repeat injection into the knee today.  Healthcare maintenance In regards to COVID booster, patient would like to wait until his 12/30 appointment to have this injection as he plans to travel tomorrow morning and states that he has had slight headache and fever with his second COVID injection and would like to avoid this prior to his trip.    PERTINENT  PMH / PSH:    OBJECTIVE:   BP 112/79   Pulse 63   Ht 6\' 3"  (1.905 m)   Wt 222 lb (100.7 kg)   SpO2 100%   BMI 27.75 kg/m   Knee: - Inspection: no gross deformity. + swelling, no effusion, no respiratory erythema or bruising. Skin intact - Palpation: no TTP - ROM: full active ROM with flexion and extension in knee  - Strength: 5/5 strength - Neuro/vasc: NV intact - Special Tests: - LIGAMENTS: negative anterior and posterior drawer, negative  Lachman's, no MCL or LCL laxity  -- MENISCUS: negative McMurray's, positive Thessaly  -- PF JOINT: nml patellar mobility bilaterally.  negative patellar grind, negative patellar apprehension   ASSESSMENT/PLAN:   Knee pain Knee Pain present for >4 months, suspect component of OA as pain responsive to NSAIDS. Patient would like steroid joint injection today.   Consent obtained and verified. Time-out conducted. Noted no overlying erythema, induration, or other signs of local infection. Skin prepped in a sterile fashion. Topical analgesic spray: Ethyl chloride. Joint: Right Knee  Needle: 22 gauge  Completed without difficulty. Meds: Depo-medrol 40 with lidocaine 1%  Advised to call if fevers/chills, erythema, induration, drainage, or persistent bleeding.  RX for voltaren gel given   Xrays ordered for right knee as no record of prior imaging of right knee, will follow up with results once available      , MD [facility]

## 2021-01-10 ENCOUNTER — Ambulatory Visit
Admission: RE | Admit: 2021-01-10 | Discharge: 2021-01-10 | Disposition: A | Discharge status: Home / Self Care | Payer: BC Managed Care – PPO | Source: Ambulatory Visit | Attending: Family Medicine | Admitting: Family Medicine

## 2021-01-10 ENCOUNTER — Ambulatory Visit (INDEPENDENT_AMBULATORY_CARE_PROVIDER_SITE_OTHER): Payer: BC Managed Care – PPO | Admitting: Family Medicine

## 2021-01-10 ENCOUNTER — Other Ambulatory Visit: Payer: Self-pay

## 2021-01-10 DIAGNOSIS — G8929 Other chronic pain: Secondary | ICD-10-CM

## 2021-01-10 DIAGNOSIS — M25561 Pain in right knee: Secondary | ICD-10-CM

## 2021-01-10 DIAGNOSIS — R7303 Prediabetes: Secondary | ICD-10-CM | POA: Diagnosis not present

## 2021-01-10 DIAGNOSIS — J4520 Mild intermittent asthma, uncomplicated: Secondary | ICD-10-CM

## 2021-01-10 MED ORDER — ALBUTEROL SULFATE HFA 108 (90 BASE) MCG/ACT IN AERS: 2.0000 | INHALATION_SPRAY | Freq: Four times a day (QID) | RESPIRATORY_TRACT | 3 refills | Status: DC | PRN

## 2021-01-10 MED ORDER — METFORMIN HCL ER 500 MG PO TB24: 500.0000 mg | ORAL_TABLET | Freq: Every day | ORAL | 0 refills | Status: DC

## 2021-01-10 MED ORDER — METHYLPREDNISOLONE ACETATE 40 MG/ML IJ SUSP
40.0000 mg | Freq: Once | INTRAMUSCULAR | Status: AC
  Administered 2021-01-10: 40 mg via INTRA_ARTICULAR

## 2021-01-10 MED ORDER — LIDOCAINE HCL (PF) 1 % IJ SOLN: 3.0000 mL | Freq: Once | INTRAMUSCULAR | Status: DC

## 2021-01-10 MED ORDER — DICLOFENAC SODIUM 1 % EX GEL: 4.0000 g | Freq: Four times a day (QID) | CUTANEOUS | 0 refills | Status: DC

## 2021-01-10 NOTE — Patient Instructions (Addendum)
As requested, we will plan to administer your COVID booster vaccine on your 12/30 appointment.  Today we use a steroid injection on your right knee to help with your pain.  I recommend taking it easy today with physical activity as you can be a little sore after a joint injection.  Please notify our office if you have worsening pain 3 to 4 days from now or noticed any redness or swelling around her knee.  I have sent refills to your pharmacy for your inhaler and metformin tablets.   Please go to [address] to [facility] for [employer] of your knee.  You do not need an appointment to have the xrays completed.

## 2021-01-10 NOTE — Assessment & Plan Note (Addendum)
Knee Pain present for >4 months, suspect component of OA as pain responsive to NSAIDS. Patient would like steroid joint injection today.   Consent obtained and verified. Time-out conducted. Noted no overlying erythema, induration, or other signs of local infection. Skin prepped in a sterile fashion. Topical analgesic spray: Ethyl chloride. Joint: Right Knee  Needle: 22 gauge  Completed without difficulty. Meds: Depo-medrol 40 with lidocaine 1%  Advised to call if fevers/chills, erythema, induration, drainage, or persistent bleeding.  RX for voltaren gel given   Xrays ordered for right knee as no record of prior imaging of right knee, will follow up with results once available

## 2021-01-16 ENCOUNTER — Telehealth: Payer: Self-pay

## 2021-01-16 NOTE — Telephone Encounter (Signed)
Received fax from pharmacy, PA needed on Diclofenac Sodium gel 1%.  Clinical questions submitted via Cover My Meds.  Waiting on response, could take up to 72 hours.  Cover My Meds info: Key: [id number]  Veronda Prude, RN

## 2021-03-01 ENCOUNTER — Other Ambulatory Visit: Payer: Self-pay

## 2021-03-01 ENCOUNTER — Ambulatory Visit (INDEPENDENT_AMBULATORY_CARE_PROVIDER_SITE_OTHER): Payer: BC Managed Care – PPO | Admitting: Student

## 2021-03-01 ENCOUNTER — Encounter: Payer: Self-pay | Admitting: Student

## 2021-03-01 VITALS — BP 127/91 | HR 71 | Ht 75.0 in | Wt 227.0 lb

## 2021-03-01 DIAGNOSIS — R7303 Prediabetes: Secondary | ICD-10-CM

## 2021-03-01 DIAGNOSIS — Z13220 Encounter for screening for lipoid disorders: Secondary | ICD-10-CM | POA: Diagnosis not present

## 2021-03-01 DIAGNOSIS — E559 Vitamin D deficiency, unspecified: Secondary | ICD-10-CM

## 2021-03-01 DIAGNOSIS — J4520 Mild intermittent asthma, uncomplicated: Secondary | ICD-10-CM

## 2021-03-01 DIAGNOSIS — M25561 Pain in right knee: Secondary | ICD-10-CM

## 2021-03-01 DIAGNOSIS — E119 Type 2 diabetes mellitus without complications: Secondary | ICD-10-CM

## 2021-03-01 LAB — POCT GLYCOSYLATED HEMOGLOBIN (HGB A1C): HbA1c, POC (controlled diabetic range): 6.2 % (ref 0.0–7.0)

## 2021-03-01 MED ORDER — ERGOCALCIFEROL 1.25 MG (50000 UT) PO CAPS: 50000.0000 [IU] | ORAL_CAPSULE | ORAL | 0 refills | Status: DC

## 2021-03-01 MED ORDER — DICLOFENAC SODIUM 1 % EX GEL
4.0000 g | Freq: Four times a day (QID) | CUTANEOUS | 0 refills | Status: DC
Start: 2021-03-01 — End: 2022-03-11

## 2021-03-01 MED ORDER — IBUPROFEN 600 MG PO TABS: 600.0000 mg | ORAL_TABLET | Freq: Three times a day (TID) | ORAL | 1 refills | Status: AC | PRN

## 2021-03-01 MED ORDER — METFORMIN HCL ER 500 MG PO TB24: 500.0000 mg | ORAL_TABLET | Freq: Every day | ORAL | 0 refills | Status: DC

## 2021-03-01 MED ORDER — ALBUTEROL SULFATE HFA 108 (90 BASE) MCG/ACT IN AERS: 2.0000 | INHALATION_SPRAY | Freq: Four times a day (QID) | RESPIRATORY_TRACT | 3 refills | Status: DC | PRN

## 2021-03-01 NOTE — Patient Instructions (Addendum)
It was great to see you! Thank you for allowing me to participate in your care!   I recommend that you always bring your medications to each appointment as this makes it easy to ensure we are on the correct medications and helps [place] not miss when refills are needed.  Our plans for today:  - Your A1c today was 6.2, continue taking your metformin daily - We are checking your kidney labs and cholesterol labs today - I refilled your medications - For your stye you may but warm compress on that eye and if your vision is being affecting by your eyelid please reach out to your eye doctor.  Today at your annual preventive visit we talked about the following measures:  I recommend 150 minutes of exercise per week-try 30 minutes 5 days per week We discussed reducing sugary beverages (like soda and juice) and increasing leafy greens and whole fruits.  We discussed avoiding tobacco and alcohol which you have been doing I recommend avoiding illicit substances.  Your blood pressure is at goal 127/91  We are checking some labs today, I will call you if they are abnormal will send you a MyChart message or a letter if they are normal.  If you do not hear about your labs in the next 2 weeks please let us know.  Take care and seek immediate care sooner if you develop any concerns. Please remember to show up 15 minutes before your scheduled appointment time!  Levin Erp, MD [facility]

## 2021-03-01 NOTE — Progress Notes (Signed)
° ° °  SUBJECTIVE:   Chief compliant/HPI: annual examination  Bradley Flores is a 50 y.o. who presents today for an annual exam.  History tabs reviewed and updated.  Review of systems form reviewed and notable for some shortness of breath with allergies and some vision changes due to age which he follow with Ophthalmology -Diabetes mellitus- Takes metformin 500 mg daily which was restarted in June of this year. Has not had repeat of A1c or renal function labs since then. A1c today was 6.2. Not eating much until 2 pm usually, eating sweets; encourage eating more vegetables -Used to play soccer but not playing currently due to knee pain. 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 -Never smoker, never alcohol, never drug use -Sexually active only with wife -Vision changes-see ophthalmology-no diabetes changes but vision worsening. Has stye on lower left eyelid and some ptosis of left eyelid but not affecting vision per ophthalmology -Colonoscopy not done yet, has number and is rescheduling this  OBJECTIVE:   BP (!) 127/91    Pulse 71    Ht 6\' 3"  (1.905 m)    Wt 227 lb (103 kg)    SpO2 98%    BMI 28.37 kg/m   General: Well appearing, NAD, awake, alert, responsive to questions Head: Normocephalic atraumatic, left eyelid drooping, stye on inner left eyelid, no crusting or drainage CV: Regular rate and rhythm no murmurs rubs or gallops Respiratory: Clear to ausculation bilaterally, no wheezes rales or crackles, chest rises symmetrically,  no increased work of breathing Abdomen: Soft, non-tender, non-distended, normoactive bowel sounds  Extremities: Moves upper and lower extremities freely, no edema in LE Neuro: No focal deficits Skin: No rashes or lesions visualized   ASSESSMENT/PLAN:   Annual Examination  See AVS for age appropriate recommendations.  PHQ score 1, reviewed and discussed.  Blood pressure value is 127/91 goal,  discussed.   Considered the following screening exams based upon USPSTF recommendations: Diabetes screening: ordered A1c was 6.2 Screening for elevated cholesterol: ordered HIV testing: discussed non-reactive 2016 Hepatitis C: discussed negative 2021 Hepatitis B: not ordered Syphilis if at high risk: not ordered  Reviewed risk factors for latent tuberculosis and not indicated Colorectal cancer screening: discussed, colonoscopy to be scheduled by patient, he says he will reach out if there are issues with scheduling Lung cancer screening:  not indicated  See documentation below regarding discussion and indication.  Advised warm compress for stye, and to reach out to eye doctor if eyelid drooping is affecting vision -BMP, Lipid panel, Urine microalbumin/creatinine ordered and will follow up -Refilled metformin, advil, albuterol, vitamin D and voltaren gel -Discussed knee XR with patient Follow up in 1 year or sooner if indicated.   2022, MD [facility]

## 2021-03-02 LAB — BASIC METABOLIC PANEL
BUN/Creatinine Ratio: 14 (ref 9–20)
BUN: 15 mg/dL (ref 6–24)
CO2: 23 mmol/L (ref 20–29)
Calcium: 9.3 mg/dL (ref 8.7–10.2)
Chloride: 105 mmol/L (ref 96–106)
Creatinine, Ser: 1.05 mg/dL (ref 0.76–1.27)
Glucose: 118 mg/dL — ABNORMAL HIGH (ref 70–99)
Potassium: 4.1 mmol/L (ref 3.5–5.2)
Sodium: 143 mmol/L (ref 134–144)
eGFR: 85 mL/min/{1.73_m2} (ref >59)

## 2021-03-02 LAB — MICROALBUMIN / CREATININE URINE RATIO
Creatinine, Urine: 182.6 mg/dL
Microalb/Creat Ratio: 4 mg/g creat (ref 0–29)
Microalbumin, Urine: 7.3 ug/mL

## 2021-03-02 LAB — LIPID PANEL
Chol/HDL Ratio: 3.7 ratio (ref 0.0–5.0)
Cholesterol, Total: 164 mg/dL (ref 100–199)
HDL: 44 mg/dL (ref >39)
LDL Chol Calc (NIH): 90 mg/dL (ref 0–99)
Triglycerides: 175 mg/dL — ABNORMAL HIGH (ref 0–149)
VLDL Cholesterol Cal: 30 mg/dL (ref 5–40)

## 2021-03-05 ENCOUNTER — Telehealth: Payer: Self-pay | Admitting: Student

## 2021-03-05 NOTE — Telephone Encounter (Signed)
Called patient about lab results and let him know LDL cholesterol was 90 and with diagnosis of diabetes ASCVD  risk 10.5%. However, only one elevated A1c of 6.5 and since on metformin has been been A1c. Shared decision making was performed explained statin medication versus dietary changes. Patient opted for dietary changes. Consider addition of statin in future if worsening lipid panel.

## 2021-03-06 ENCOUNTER — Telehealth: Payer: Self-pay

## 2021-03-06 ENCOUNTER — Encounter: Payer: Self-pay | Admitting: Student

## 2021-03-06 NOTE — Telephone Encounter (Signed)
Rec'd a prior auth from pharmacy for pt's voltaren gel through CoverMyMeds.  Informed pt this medication is now OTC and can be purchased for under $10. Pt expressed understanding.  PA deleted (key: [id number])

## 2021-08-06 ENCOUNTER — Encounter: Payer: Self-pay | Admitting: *Deleted

## 2022-03-11 ENCOUNTER — Encounter: Payer: Self-pay | Admitting: Family Medicine

## 2022-03-11 ENCOUNTER — Encounter: Payer: BC Managed Care – PPO | Admitting: Family Medicine

## 2022-03-11 ENCOUNTER — Ambulatory Visit (INDEPENDENT_AMBULATORY_CARE_PROVIDER_SITE_OTHER): Payer: BC Managed Care – PPO | Admitting: Family Medicine

## 2022-03-11 VITALS — BP 118/76 | HR 73 | Wt 230.8 lb

## 2022-03-11 DIAGNOSIS — Z23 Encounter for immunization: Secondary | ICD-10-CM

## 2022-03-11 DIAGNOSIS — Z0000 Encounter for general adult medical examination without abnormal findings: Secondary | ICD-10-CM

## 2022-03-11 DIAGNOSIS — M25561 Pain in right knee: Secondary | ICD-10-CM

## 2022-03-11 DIAGNOSIS — R7303 Prediabetes: Secondary | ICD-10-CM

## 2022-03-11 DIAGNOSIS — G8929 Other chronic pain: Secondary | ICD-10-CM

## 2022-03-11 LAB — POCT GLYCOSYLATED HEMOGLOBIN (HGB A1C): HbA1c, POC (controlled diabetic range): 6.1 % (ref 0.0–7.0)

## 2022-03-11 MED ORDER — ZOSTER VAC RECOMB ADJUVANTED 50 MCG/0.5ML IM SUSR: 0.5000 mL | Freq: Once | INTRAMUSCULAR | 0 refills | Status: AC

## 2022-03-11 NOTE — Progress Notes (Signed)
    SUBJECTIVE:   Chief complaint/HPI: annual examination  Bradley Flores is a 52 y.o. who presents today for an annual exam.   Current concerns:  R knee pain- generalized anterior right knee pain, intermittent over past several years, more in the past few months.  Sometimes with associated swelling as well.  Had injection several years ago which helped temporarily.  Takes ibuprofen as needed but does not like to use this frequently.  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 Played soccer regularly although hasn't played for the last 2 years or so because of his knee-- wants to get back to this.  History tabs reviewed and updated.   OBJECTIVE:   BP 118/76   Pulse 73   Wt 230 lb 12.8 oz (104.7 kg)   SpO2 98%   BMI 28.85 kg/m   Gen: NAD, pleasant, able to participate in exam HEENT: [place]/AT, PERRLA, nares patent bilaterally, TM normal bilaterally, oropharynx unremarkable Neck: supple, no cervical or supraclavicular lymphadenopathy CV: RRR, normal S1/S2, no murmur Resp: Normal effort, lungs CTAB GI: abdomen soft, non-tender, non-distended Extremities: no edema or cyanosis.  R Knee: inspection is normal without deformity or skin changes, no appreciable swelling or effusion. No tenderness to palpation. FROM. 5/5 strength with flexion and extension. Negative anterior and posterior drawer, no increased varus/valgus laxity, normal gait Skin: warm and dry, no rashes noted Neuro: alert, no obvious focal deficits Psych: Normal affect and mood   ASSESSMENT/PLAN:   Right knee pain Chronic, dating back to at least 2019 per chart review.  Flaring more over past few months. Suspect osteoarthritis. Patient interested in seeing specialist, would like to discuss need for additional imaging as he's heard a lot about ligament injuries related to playing soccer.  Possibly interested in repeat injections as well.  Sports  med referral placed.  Prediabetes A1c 6.1% today, stable for at least the past 5 years.  Discussed lifestyle modifications and recommended specific dietary changes.  Monitor A1c annually.  Check lipids and BMP today   Annual Examination  See AVS for age appropriate recommendations.  PHQ score 2, reviewed and discussed.  Blood pressure value is at goal, discussed.   Considered the following screening exams based upon USPSTF recommendations: Diabetes screening: ordered, see prediabetes above Screening for elevated cholesterol: ordered HIV testing:  negative in 2016- repeat not indicated Hepatitis C:  negative in 2021- repeat not indicated Syphilis if at high risk:  not at high risk Colorectal cancer screening:  Discussed, patient had to reschedule his colonoscopy due to illness in his family .  Encouraged him to contact GI to reschedule.  Lung cancer screening:  not indicated    PSA discussed and after engaging in discussion of possible risks, benefits and complications of screening patient elected to forego PSA screening.  Immunizations: flu and COVID administered today. Patient given printed Rx to get shingrix at his pharmacy.  Follow up in 1 year or sooner if indicated.    Alcus Dad, MD [place]

## 2022-03-11 NOTE — Patient Instructions (Addendum)
It was great to see you!  Things we discussed at today's visit: -Your A1c was 6.1% today.  This means you have prediabetes. This has been pretty stable for the past 5 years. -We will continue to check this number annually to make sure it does not progress to diabetes.  -You do not need medication at this time.  Work on [place] choices (avoid sugar sweetened beverages including soda, juice, sweet tea, lemonade, Gatorade, etc) -Please get your Shingles vaccine at your pharmacy. -Contact the GI doctor to reschedule your colonoscopy. If you need a new referral please let me know.  For your knee pain, you can take ibuprofen as needed when it is bothering you.  I suspect your pain is related to arthritis.  If you wish to see the sports medicine specialist for further evaluation and injections, you can call them at [phone].  I will also place a referral.  We are checking some labs. We will send you a MyChart message with the results or call if they are abnormal.   Take care and seek immediate care sooner if you develop any concerns.  Dr[facility]

## 2022-03-11 NOTE — Assessment & Plan Note (Addendum)
Chronic, dating back to at least 2019 per chart review.  Flaring more over past few months. Suspect osteoarthritis. Patient interested in seeing specialist, would like to discuss need for additional imaging as he's heard a lot about ligament injuries related to playing soccer.  Possibly interested in repeat injections as well.  Sports med referral placed.

## 2022-03-11 NOTE — Assessment & Plan Note (Signed)
A1c 6.1% today, stable for at least the past 5 years.  Discussed lifestyle modifications and recommended specific dietary changes.  Monitor A1c annually.  Check lipids and BMP today

## 2022-03-12 LAB — LIPID PANEL
Chol/HDL Ratio: 3.7 ratio (ref 0.0–5.0)
Cholesterol, Total: 158 mg/dL (ref 100–199)
HDL: 43 mg/dL (ref >39)
LDL Chol Calc (NIH): 93 mg/dL (ref 0–99)
Triglycerides: 122 mg/dL (ref 0–149)
VLDL Cholesterol Cal: 22 mg/dL (ref 5–40)

## 2022-03-12 LAB — BASIC METABOLIC PANEL
BUN/Creatinine Ratio: 13 (ref 9–20)
BUN: 13 mg/dL (ref 6–24)
CO2: 20 mmol/L (ref 20–29)
Calcium: 9.3 mg/dL (ref 8.7–10.2)
Chloride: 104 mmol/L (ref 96–106)
Creatinine, Ser: 1.03 mg/dL (ref 0.76–1.27)
Glucose: 114 mg/dL — ABNORMAL HIGH (ref 70–99)
Potassium: 4.8 mmol/L (ref 3.5–5.2)
Sodium: 142 mmol/L (ref 134–144)
eGFR: 87 mL/min/{1.73_m2} (ref >59)

## 2022-07-01 IMAGING — DX DG KNEE COMPLETE 4+V*R*
4 series · 4 of 4 positions shown · non-contrast
Comparison: None.

CLINICAL DATA: Chronic knee pain.  Knee injection today.

EXAM:
RIGHT KNEE - COMPLETE 4+ VIEW

[dg knee 4 v w/ sunrise/patella right (1 of 4)]
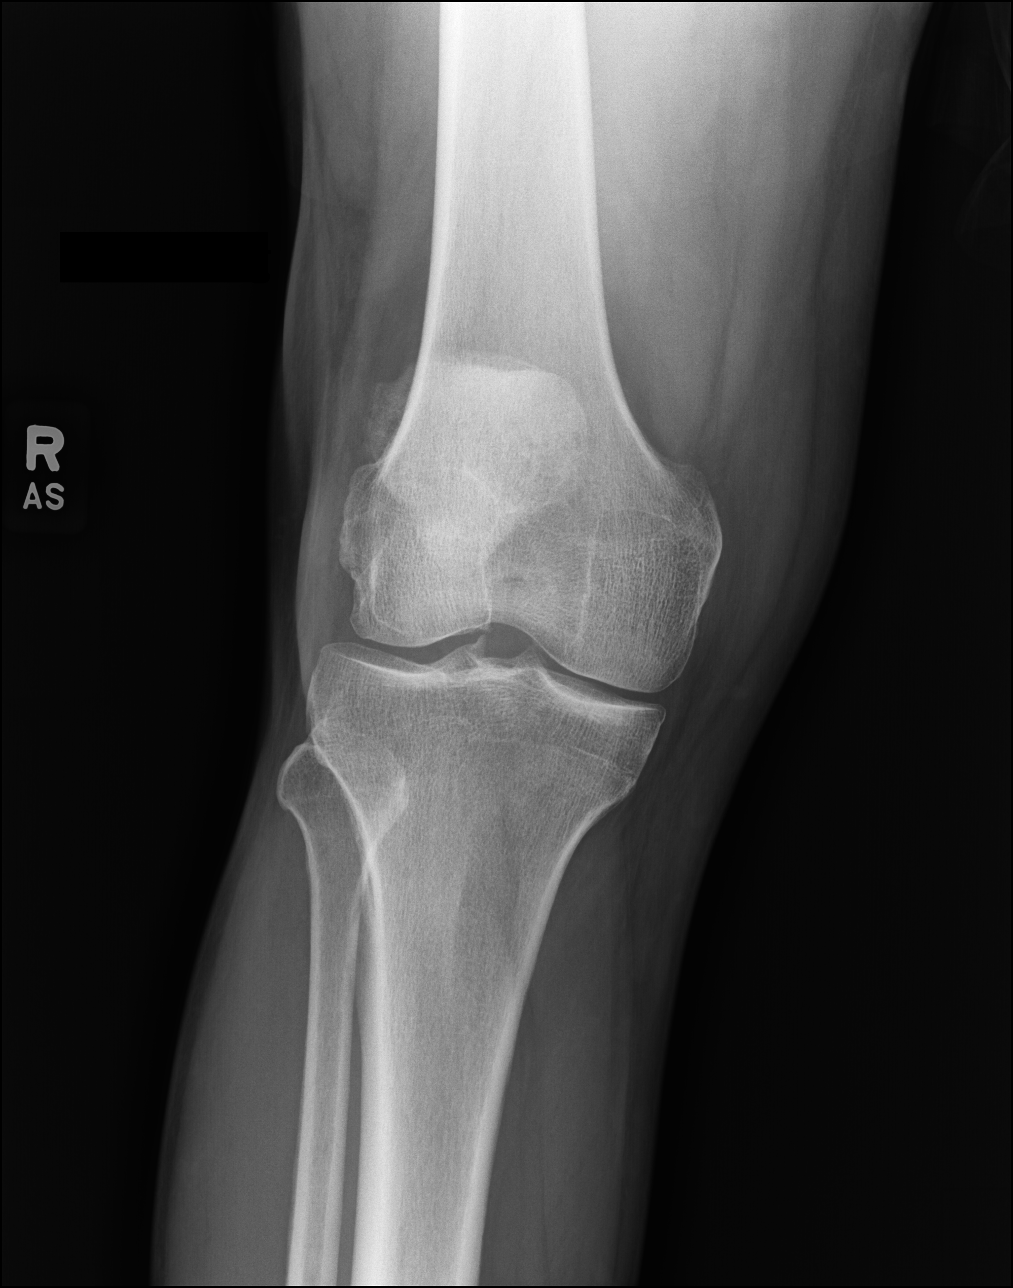

[dg knee 4 v w/ sunrise/patella right (2 of 4)]
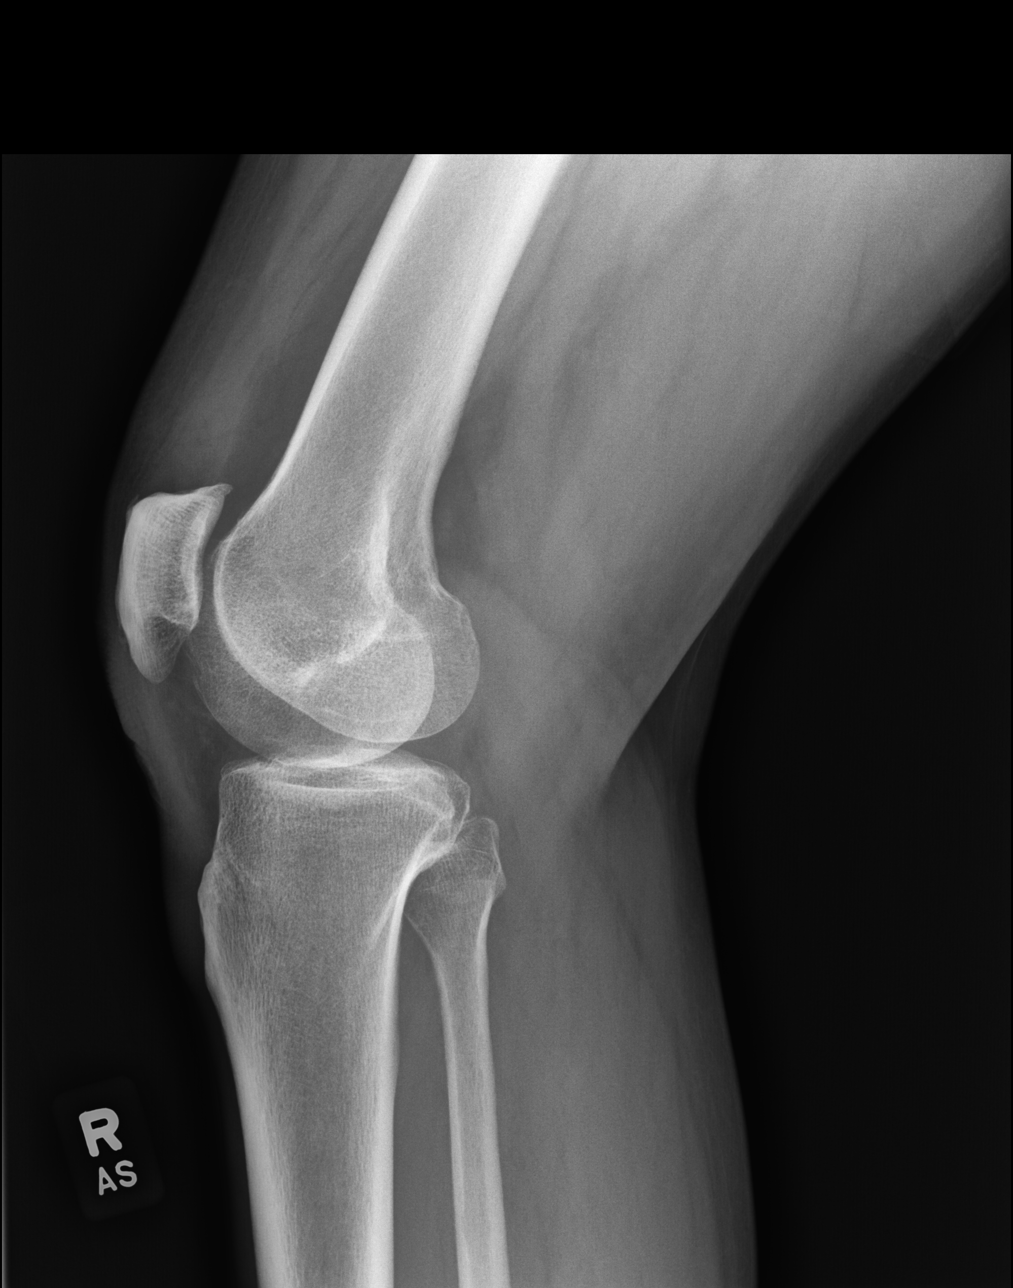

[dg knee 4 v w/ sunrise/patella right (3 of 4)]
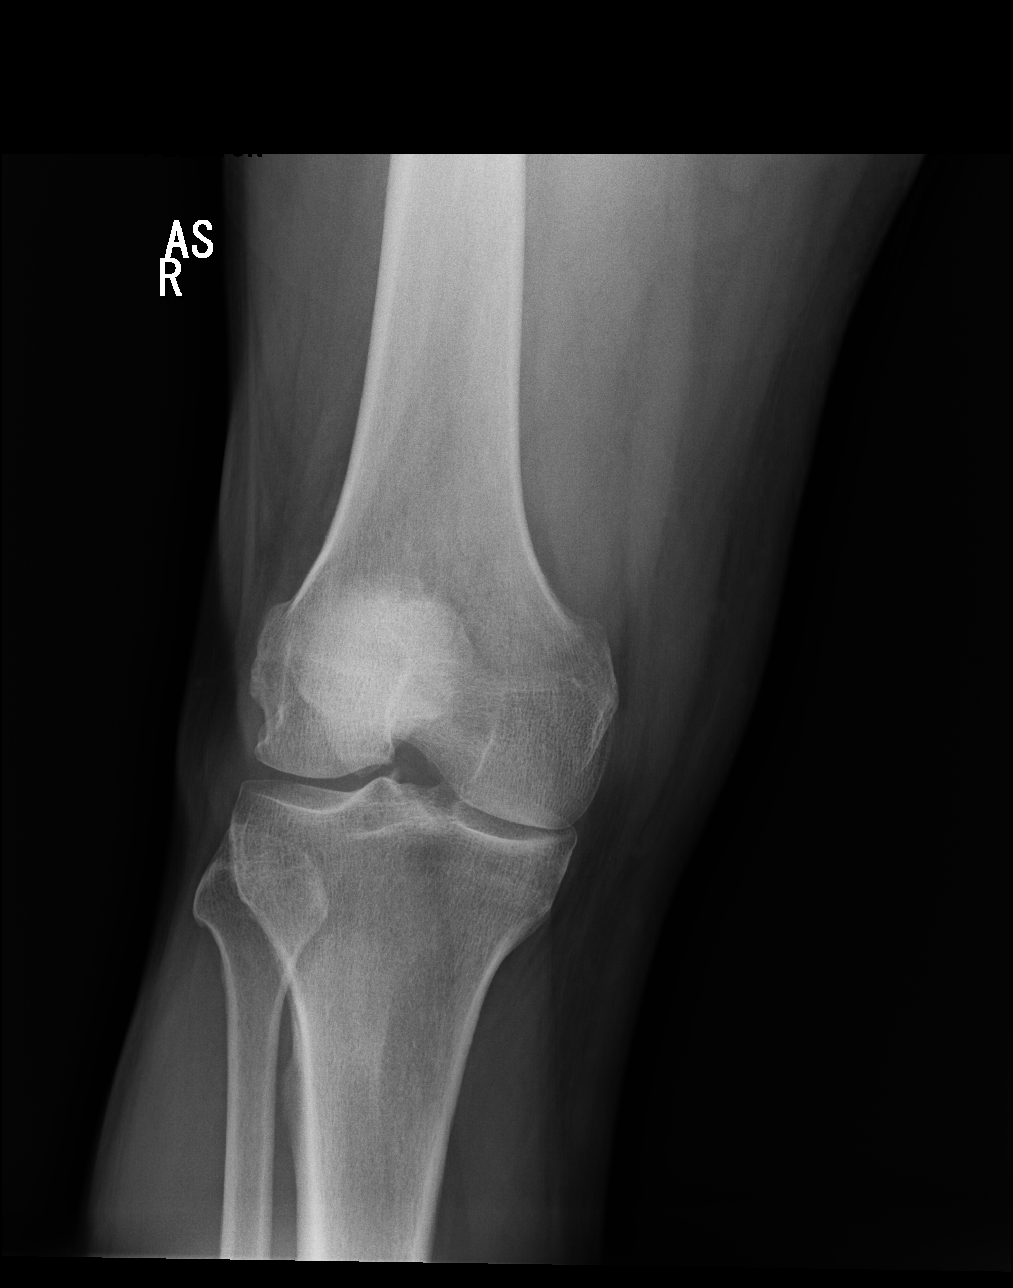

[dg knee 4 v w/ sunrise/patella right (4 of 4)]
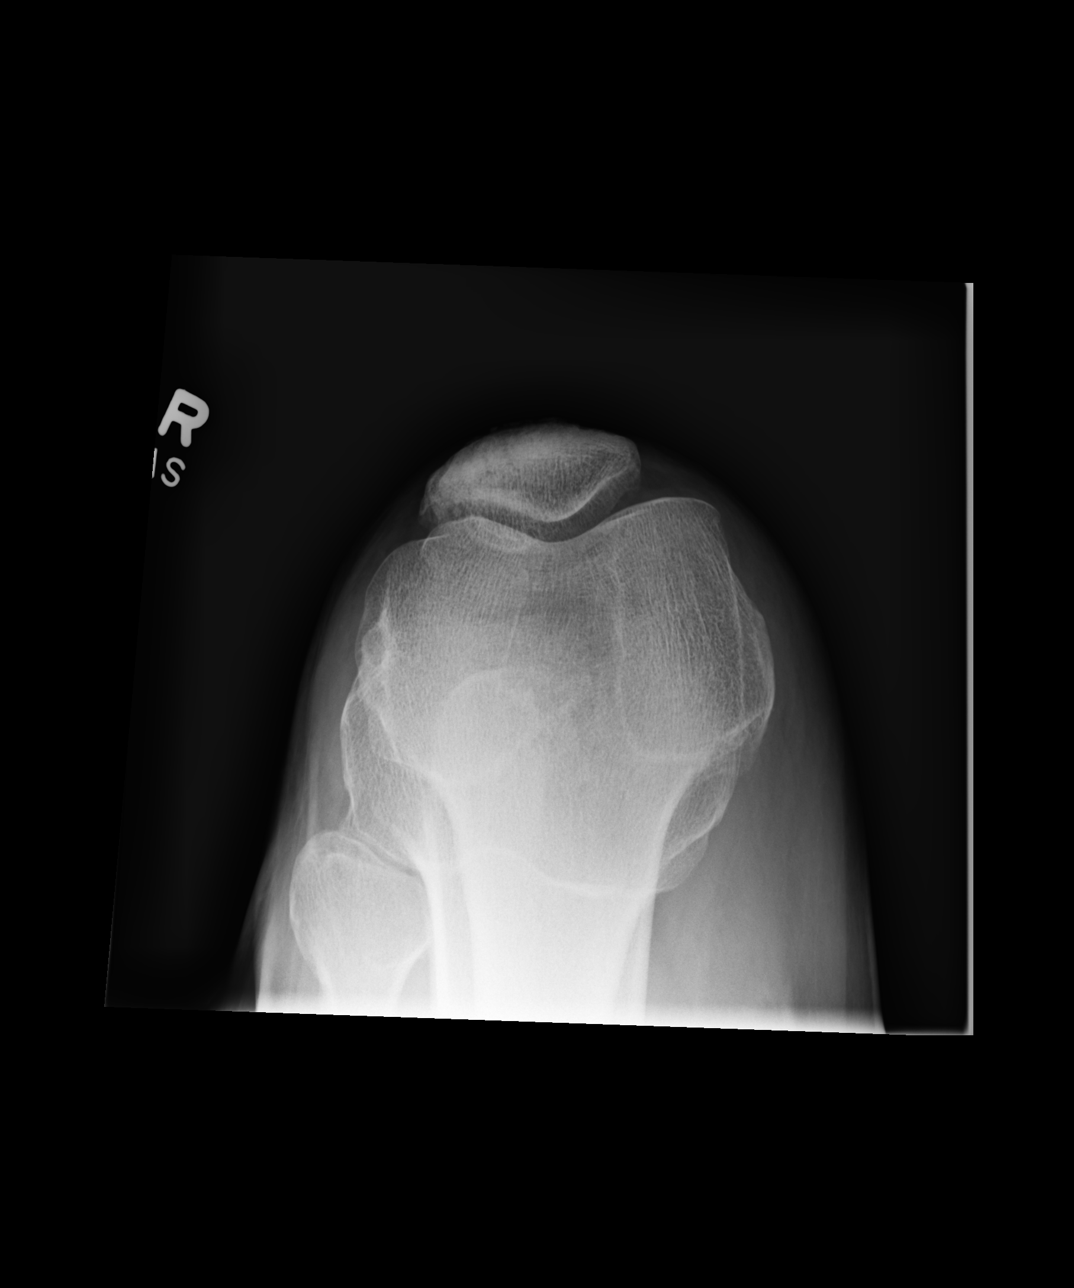

[4 of 4 positions shown; findings below may reference images not displayed]

FINDINGS: No evidence of fracture, dislocation, or joint effusion. There is
mild medial compartment and patellofemoral compartment joint space
narrowing. There is minimal spurring of the tibial spines. There is
mild osteophyte formation at the patellofemoral joint. Soft tissues
are unremarkable.
IMPRESSION: 1. No acute bony abnormality.
2. Mild degenerative changes.

## 2023-01-13 ENCOUNTER — Encounter: Payer: Self-pay | Admitting: Student

## 2023-01-13 ENCOUNTER — Ambulatory Visit: Payer: BC Managed Care – PPO | Admitting: Student

## 2023-01-13 ENCOUNTER — Ambulatory Visit (INDEPENDENT_AMBULATORY_CARE_PROVIDER_SITE_OTHER): Payer: BC Managed Care – PPO | Admitting: Student

## 2023-01-13 VITALS — BP 139/84 | HR 65 | Ht 75.0 in | Wt 222.2 lb

## 2023-01-13 DIAGNOSIS — G8929 Other chronic pain: Secondary | ICD-10-CM

## 2023-01-13 DIAGNOSIS — M25561 Pain in right knee: Secondary | ICD-10-CM | POA: Diagnosis not present

## 2023-01-13 MED ORDER — MELOXICAM 15 MG PO TABS: 15.0000 mg | ORAL_TABLET | Freq: Every day | ORAL | 0 refills | Status: DC

## 2023-01-13 NOTE — Progress Notes (Deleted)
    SUBJECTIVE:   CHIEF COMPLAINT / HPI:   ***  PERTINENT  PMH / PSH: ***  OBJECTIVE:   BP (!) 132/91   Pulse 65   Ht 6\' 3"  (1.905 m)   Wt 222 lb 3.2 oz (100.8 kg)   SpO2 100%   BMI 27.77 kg/m    General: NAD, pleasant, *** HEENT: Normocephalic, atraumatic head. Normal external ear, canal, TM bilaterally. EOM intact and normal conjunctiva BL. Normal external nose. Throat not erythematous, no exudate, no deviation. Normal dentition.  Cardio: RRR, no MRG. Cap Refill <2s. Respiratory: CTAB, normal wob on RA GI: Abdomen is soft, not tender, not distended. BS present MSK: *** Skin: Warm and dry Neuro: CN II: PERRL CN III, IV,VI: EOMI CV V: Normal sensation in V1, V2, V3 CVII: Symmetric smile and brow raise CN VIII: Normal hearing CN IX,X: Symmetric palate raise  CN XI: 5/5 shoulder shrug CN XII: Symmetric tongue protrusion  UE and LE strength 5/5 2+ UE and LE reflexes  Normal sensation in UE and LE bilaterally  No ataxia with finger to nose, normal heel to shin  Negative Rhomberg    ASSESSMENT/PLAN:   No problem-specific Assessment & Plan notes found for this encounter.     Tiffany Kocher, DO [facility]

## 2023-01-13 NOTE — Patient Instructions (Signed)
It was great to see you! Thank you for allowing me to participate in your care!   I recommend that you always bring your medications to each appointment as this makes it easy to ensure we are on the correct medications and helps [place] not miss when refills are needed.  Our plans for today:  - take 15 mg of meloxicam daily, you can also take tylenol 1000 mg every 6 hours as needed - I have sent a referral to sports medicine, they will call you to schedule an appointment   Take care and seek immediate care sooner if you develop any concerns. Please remember to show up 15 minutes before your scheduled appointment time!  Tiffany Kocher, DO [facility]

## 2023-01-13 NOTE — Assessment & Plan Note (Signed)
Acute on chronic right knee pain with effusion, suspect 2/2 osteoarthritis.  Cannot completely rule out soft tissue injury. Recommend analgesics and follow-up with sports medicine. - Meloxicam 15 mg daily for 14 days, Tylenol as needed - Referral to sports medicine

## 2023-01-13 NOTE — Progress Notes (Deleted)
  SUBJECTIVE:   CHIEF COMPLAINT / HPI:   Rt Knee Pain  -Seen 1/24 for chronic R. Knee Pain, referral sport's med  PERTINENT  PMH / PSH: ***  Past Medical History:  Diagnosis Date   Allergic rhinitis    Allergy    Asthma    Lactose intolerance    gas, diarrhea    Pre-diabetes    OBJECTIVE:  There were no vitals taken for this visit. Physical Exam   ASSESSMENT/PLAN:   Assessment & Plan  No follow-ups on file. Bess Kinds, MD 01/13/2023, 7:02 AM PGY-***, [facility] {    This will disappear when note is signed, click to select method of visit    :1}

## 2023-01-13 NOTE — Progress Notes (Signed)
    SUBJECTIVE:   CHIEF COMPLAINT / HPI:   Right knee pain Chronic right knee pain, dating to at least 2019 per chart review.  He was referred to sports medicine at the start of the new year, however he left the country and did not follow-up.  He has received corticosteroid shots in the past which have been helpful.  However, over the past week he has now had increased swelling and pain of his right knee.  No fevers, chills, sweats, NVD, shortness of breath, fatigue.  He is able to bear weight.  OBJECTIVE:   BP 139/84   Pulse 65   Ht 6\' 3"  (1.905 m)   Wt 222 lb 3.2 oz (100.8 kg)   SpO2 100%   BMI 27.77 kg/m    General: NAD, pleasant Cardio: RRR, no MRG. Respiratory: CTAB, normal wob on RA Right knee: No gross deformities, large effusion. No TTP. FROM.  5/5 muscle strength.  Normal sensation.  Weightbearing.  Stable joint with ligamental stress testing. Skin: Warm and dry  ASSESSMENT/PLAN:   Assessment & Plan Chronic pain of right knee Acute on chronic right knee pain with effusion, suspect 2/2 osteoarthritis.  Cannot completely rule out soft tissue injury. Recommend analgesics and follow-up with sports medicine. - Meloxicam 15 mg daily for 14 days, Tylenol as needed - Referral to sports medicine  Tiffany Kocher, DO [facility]

## 2023-01-19 ENCOUNTER — Ambulatory Visit (INDEPENDENT_AMBULATORY_CARE_PROVIDER_SITE_OTHER): Payer: BC Managed Care – PPO | Admitting: Family Medicine

## 2023-01-19 ENCOUNTER — Other Ambulatory Visit: Payer: Self-pay

## 2023-01-19 ENCOUNTER — Encounter: Payer: Self-pay | Admitting: Family Medicine

## 2023-01-19 VITALS — BP 136/82 | Ht 75.0 in | Wt 222.0 lb

## 2023-01-19 DIAGNOSIS — G8929 Other chronic pain: Secondary | ICD-10-CM

## 2023-01-19 DIAGNOSIS — M2391 Unspecified internal derangement of right knee: Secondary | ICD-10-CM | POA: Diagnosis not present

## 2023-01-19 DIAGNOSIS — M25561 Pain in right knee: Secondary | ICD-10-CM

## 2023-01-19 NOTE — Progress Notes (Signed)
PCP: Cyndia Skeeters, DO  Subjective:  CC: Rt knee pain HPI: Patient is a 52 y.o. male here for evaluation of right knee pain.  Previously seen by PCP office on 01/13/2023 for right knee pain.  Was given meloxicam daily for 2 weeks given presumed OA (this was demonstrated on 2022 radiographs).  He was referred here for further evaluation.  Chronic pain in the Rt knee for 2+ years No specific injury or trauma NSAIDs have been somewhat effective in the past Prior CSI 2022 which was helpful  Today, his pain and swelling is much improved since he was seen in the office with PCP.   - Still has ongoing swelling - He still has pain mostly on the medial side and posterior aspect of the knee - He feels as if the knee catches and locks often.  Does feel unstable at times, but has not fallen - has tried knee sleeve, but not helpful  He has a long history of playing soccer, and he would like to get back to this.  Past Medical History:  Diagnosis Date   Allergic rhinitis    Allergy    Asthma    Lactose intolerance    gas, diarrhea    Pre-diabetes     Current Outpatient Medications on File Prior to Visit  Medication Sig Dispense Refill   ibuprofen (ADVIL) 600 MG tablet Take 1 tablet (600 mg total) by mouth every 8 (eight) hours as needed. 30 tablet 1   meloxicam (MOBIC) 15 MG tablet Take 1 tablet (15 mg total) by mouth daily. 14 tablet 0   No current facility-administered medications on file prior to visit.    Past Surgical History:  Procedure Laterality Date   FRACTURE SURGERY Right    shoulder    Allergies  Allergen Reactions   Shrimp (Diagnostic) Hives    Hives and shortness of breath     BP 136/82   Ht 6\' 3"  (1.905 m)   Wt 222 lb (100.7 kg)   BMI 27.75 kg/m       No data to display              No data to display              Objective:  Physical Exam:  Gen: NAD, comfortable in exam room  Right knee: No gross deformity, ecchymoses.  Moderate  effusion. TTP of medial joint line  TTP posteriorly in popliteal fossa. Near FROM with normal strength though pain elicited with terminal flexion Negative ant/post drawers.  Negative valgus/varus testing, though pain elicited with valgus testing, but no MCL laxity.  Negative lachman. Positive mcmurrays, apleys, Thessalys. LT knee with FROM without pain or swelling Walking with antalgic gait NV intact distally.  IMAGING: Limited ultrasound right knee:  - Anechoic fluid collection observed in suprapatellar pouch.   - Minimal hypoechoic changes of the medial meniscus.  Slight spurring noted along medial joint space - More pronounced hypoechoic changes of lateral meniscus.  No significant spurring noted along lateral joint space - slight thinning of trochlear cartilage  IMPRESSION: - moderate knee effusion - suspected degenerative meniscus tear - knee degenerative changes c/w OA  Assessment & Plan Chronic pain of right knee Chronic Rt knee pain x 2+ years with associated OA and worsening swelling/mechanical symptoms - Ddx: worsening OA vs internal derangement/mensical tear - suspect meniscal pathology given current presentation  PLAN: - IMAGING: personally reviewed prior Rt knee XR 01/11/2021 showing mild joint space narrowing of the medial  and PF compartments - Limited MSK u/s today suggestive of meniscus injury - MRI Rt knee r/o meniscus tear or other abnormality.  Has experienced chronic pain x 2+ years, prior xray 01/2021, no new injury/trauma, worsening symptoms, limited improvement with 6+ weeks of NSAIDs, bracing, limited improvement with 6+ weeks of provider-directed therapy.  Has failed extensive conservative therapy and would be interested in surgical intervention if indicated - cont Mobic prn - f/u pending MRI  Internal derangement of right knee Chronic Rt knee pain x 2+ years with associated OA and worsening swelling/mechanical symptoms - Ddx: worsening OA vs internal  derangement/mensical tear - suspect meniscal pathology given current presentation  PLAN: - IMAGING: personally reviewed prior Rt knee XR 01/11/2021 showing mild joint space narrowing of the medial and PF compartments - Limited MSK u/s today suggestive of meniscus injury - MRI Rt knee r/o meniscus tear or other abnormality.  Has experienced chronic pain x 2+ years, prior xray 01/2021, no new injury/trauma, worsening symptoms, limited improvement with 6+ weeks of NSAIDs, bracing, limited improvement with 6+ weeks of provider-directed therapy.  Has failed extensive conservative therapy and would be interested in surgical intervention if indicated - f/u pending MRI - discussed possible role of future surgical procedures and/or injections  Janeal Holmes, MD PGY-2, [facility]  Addendum:  Patient seen and examined in the office with resident - Burna Forts, PGY-2.  History, exam, imaging, plan of care were precepted with me.  Agree with findings and plan as documented in resident note with additions/modifications made above.  Darene Lamer, DO, CAQSM

## 2023-01-19 NOTE — Assessment & Plan Note (Addendum)
Chronic Rt knee pain x 2+ years with associated OA and worsening swelling/mechanical symptoms - Ddx: worsening OA vs internal derangement/mensical tear - suspect meniscal pathology given current presentation  PLAN: - IMAGING: personally reviewed prior Rt knee XR 01/11/2021 showing mild joint space narrowing of the medial and PF compartments - Limited MSK u/s today suggestive of meniscus injury - MRI Rt knee r/o meniscus tear or other abnormality.  Has experienced chronic pain x 2+ years, prior xray 01/2021, no new injury/trauma, worsening symptoms, limited improvement with 6+ weeks of NSAIDs, bracing, limited improvement with 6+ weeks of provider-directed therapy.  Has failed extensive conservative therapy and would be interested in surgical intervention if indicated - cont Mobic prn - f/u pending MRI

## 2023-01-24 ENCOUNTER — Other Ambulatory Visit: Payer: Self-pay | Admitting: Student

## 2023-01-24 DIAGNOSIS — G8929 Other chronic pain: Secondary | ICD-10-CM

## 2023-02-16 ENCOUNTER — Ambulatory Visit
Admission: RE | Admit: 2023-02-16 | Discharge: 2023-02-16 | Disposition: A | Discharge status: Home / Self Care | Payer: BC Managed Care – PPO | Source: Ambulatory Visit | Attending: Family Medicine | Admitting: Family Medicine

## 2023-02-16 DIAGNOSIS — M2391 Unspecified internal derangement of right knee: Secondary | ICD-10-CM

## 2023-02-16 DIAGNOSIS — M23341 Other meniscus derangements, anterior horn of lateral meniscus, right knee: Secondary | ICD-10-CM | POA: Diagnosis not present

## 2023-02-16 DIAGNOSIS — M7651 Patellar tendinitis, right knee: Secondary | ICD-10-CM | POA: Diagnosis not present

## 2023-02-16 DIAGNOSIS — M25562 Pain in left knee: Secondary | ICD-10-CM | POA: Diagnosis not present

## 2023-02-18 ENCOUNTER — Encounter: Payer: Self-pay | Admitting: Family Medicine

## 2023-02-18 DIAGNOSIS — M1711 Unilateral primary osteoarthritis, right knee: Secondary | ICD-10-CM | POA: Insufficient documentation

## 2023-02-18 DIAGNOSIS — S83271A Complex tear of lateral meniscus, current injury, right knee, initial encounter: Secondary | ICD-10-CM | POA: Insufficient documentation

## 2023-03-09 ENCOUNTER — Other Ambulatory Visit: Payer: Self-pay

## 2023-03-09 DIAGNOSIS — M2391 Unspecified internal derangement of right knee: Secondary | ICD-10-CM

## 2023-03-09 DIAGNOSIS — G8929 Other chronic pain: Secondary | ICD-10-CM

## 2023-03-12 ENCOUNTER — Ambulatory Visit (INDEPENDENT_AMBULATORY_CARE_PROVIDER_SITE_OTHER): Payer: BC Managed Care – PPO | Admitting: Family Medicine

## 2023-03-12 ENCOUNTER — Encounter: Payer: Self-pay | Admitting: Family Medicine

## 2023-03-12 VITALS — BP 134/83 | HR 69 | Ht 75.0 in | Wt 230.0 lb

## 2023-03-12 DIAGNOSIS — R7303 Prediabetes: Secondary | ICD-10-CM

## 2023-03-12 DIAGNOSIS — J4520 Mild intermittent asthma, uncomplicated: Secondary | ICD-10-CM

## 2023-03-12 DIAGNOSIS — Z791 Long term (current) use of non-steroidal anti-inflammatories (NSAID): Secondary | ICD-10-CM

## 2023-03-12 DIAGNOSIS — Z0000 Encounter for general adult medical examination without abnormal findings: Secondary | ICD-10-CM | POA: Diagnosis not present

## 2023-03-12 DIAGNOSIS — J309 Allergic rhinitis, unspecified: Secondary | ICD-10-CM

## 2023-03-12 MED ORDER — ALBUTEROL SULFATE HFA 108 (90 BASE) MCG/ACT IN AERS: 2.0000 | INHALATION_SPRAY | RESPIRATORY_TRACT | 2 refills | Status: AC | PRN

## 2023-03-12 MED ORDER — CETIRIZINE HCL 10 MG PO TABS: 10.0000 mg | ORAL_TABLET | Freq: Every day | ORAL | 11 refills | Status: AC

## 2023-03-12 NOTE — Progress Notes (Signed)
    SUBJECTIVE:   Chief compliant/HPI: annual examination  Bradley Flores is a 53 y.o. who presents today for an annual exam.   Would like refills of ventolin  inhaler. Uses once every 2 weeks typically, but more frequently in the spring. Also uses cetirizine  and requests a refill of this today.  History tabs reviewed and updated.   Review of systems form reviewed and notable for .   OBJECTIVE:   BP 134/83   Pulse 69   Ht 6' 3 (1.905 m)   Wt 230 lb (104.3 kg)   SpO2 100%   BMI 28.75 kg/m   General: Well-appearing, very pleasant, no acute distress. HEENT: normocephalic, EOMs grossly intact, MMM. Cardio: Regular rate, regular rhythm, no murmurs on exam. Pulm: Clear, no wheezing, no crackles. No increased work of breathing. Extremities: no peripheral edema. Moves all extremities equally. Neuro: Alert and oriented x3, speech normal in content, no facial asymmetry. Psych:  Cognition and judgment appear intact. Alert, communicative, and cooperative with normal attention span and concentration.   ASSESSMENT/PLAN:   Routine health maintenance Patient is doing very well overall.  He is due for colonoscopy and lab work as below.  Discussion and education provided on PSA screening, and patient does wish to proceed with this today. -Referral placed for colonoscopy - Recheck A1c today; last A1c 1/24 was 6.1 - Will check BMP today as patient has long-term use of NSAIDs for knee pain - Patient had a normal lipid panel 1/24; discussed with pt and decided not to recheck at this time -Will check PSA per patient preference   Mild intermittent asthma without complication Patient reports using Ventolin  inhaler once every other week.  He does need this more frequently in the spring. - Refill Ventolin  inhaler and cetirizine  today - Instructed patient to return to clinic for additional evaluation should his breathing become more difficult this spring, or if he needs to use his inhaler  several times a week.    Annual Examination  See AVS for age appropriate recommendations.  Blood pressure value is at goal, discussed.   Considered the following screening exams based upon USPSTF recommendations: Diabetes screening:  A1c 6.03 Mar 2022; will recheck today. Screening for elevated cholesterol:  normal lipid panel Jan 2024 HIV testing:  nonreactive 2016 Hepatitis C:  negative 2021 Hepatitis B: discussed Syphilis if at high risk: discussed Reviewed risk factors for latent tuberculosis and not indicated Colorectal cancer screening: discussed, colonoscopy ordered Lung cancer screening: discussed - pt never smoker, not indicated PSA discussed and after engaging in discussion of possible risks, benefits and complications of screening patient elected to proceed with PSA screening.   Follow up in 1 year or sooner if indicated.    Lauraine Norse, DO [facility]

## 2023-03-12 NOTE — Assessment & Plan Note (Signed)
 Patient is doing very well overall.  He is due for colonoscopy and lab work as below.  Discussion and education provided on PSA screening, and patient does wish to proceed with this today. -Referral placed for colonoscopy - Recheck A1c today; last A1c 1/24 was 6.1 - Will check BMP today as patient has long-term use of NSAIDs for knee pain - Patient had a normal lipid panel 1/24; discussed with pt and decided not to recheck at this time -Will check PSA per patient preference

## 2023-03-12 NOTE — Patient Instructions (Signed)
 Dear Bradley Flores, it was so good to see you today!  You are very healthy.  Keep up the good work!  Today we discussed the following concerns and plans:  -I will refer you for colonoscopy.  If you do not get a call to schedule this, please let me know. - If you have breathing trouble in the spring or need to use your inhaler several times a week, please come in for an appointment so we can discuss this. I will refill your inhaler and cetirizine  today. - We are getting some lab work today.  I will let you know the results.   If you have any concerns, please call the clinic or schedule an appointment.  It was a pleasure to take care of you today. Be well!  Lauraine Norse, DO  Family Medicine, PGY-1

## 2023-03-12 NOTE — Assessment & Plan Note (Signed)
 Patient reports using Ventolin  inhaler once every other week.  He does need this more frequently in the spring. - Refill Ventolin  inhaler and cetirizine  today - Instructed patient to return to clinic for additional evaluation should his breathing become more difficult this spring, or if he needs to use his inhaler several times a week.

## 2023-03-13 LAB — HEMOGLOBIN A1C
Est. average glucose Bld gHb Est-mCnc: 134 mg/dL
Hgb A1c MFr Bld: 6.3 % — ABNORMAL HIGH (ref 4.8–5.6)

## 2023-03-13 LAB — BASIC METABOLIC PANEL
BUN/Creatinine Ratio: 10 (ref 9–20)
BUN: 11 mg/dL (ref 6–24)
CO2: 24 mmol/L (ref 20–29)
Calcium: 9.2 mg/dL (ref 8.7–10.2)
Chloride: 99 mmol/L (ref 96–106)
Creatinine, Ser: 1.1 mg/dL (ref 0.76–1.27)
Glucose: 108 mg/dL — ABNORMAL HIGH (ref 70–99)
Potassium: 4.4 mmol/L (ref 3.5–5.2)
Sodium: 140 mmol/L (ref 134–144)
eGFR: 80 mL/min/{1.73_m2} (ref >59)

## 2023-03-13 LAB — PSA: Prostate Specific Ag, Serum: 0.6 ng/mL (ref 0.0–4.0)

## 2023-03-19 ENCOUNTER — Encounter: Payer: Self-pay | Admitting: Family Medicine

## 2023-09-26 DIAGNOSIS — X131XXA Other contact with steam and other hot vapors, initial encounter: Secondary | ICD-10-CM | POA: Diagnosis not present

## 2023-09-26 DIAGNOSIS — S60122A Contusion of left index finger with damage to nail, initial encounter: Secondary | ICD-10-CM | POA: Diagnosis not present

## 2023-09-26 DIAGNOSIS — T23222A Burn of second degree of single left finger (nail) except thumb, initial encounter: Secondary | ICD-10-CM | POA: Diagnosis not present

## 2024-03-21 NOTE — Progress Notes (Unsigned)
" ° ° °  SUBJECTIVE:   Chief compliant/HPI: annual examination  Bradley Flores is a 54 y.o. who presents today for an annual exam.   History tabs reviewed and updated ***.   Review of systems form reviewed and notable for ***.   OBJECTIVE:   There were no vitals taken for this visit.  General: ***-appearing, no acute distress. HEENT: normocephalic, PERRLA, EOM grossly intact, MMM, bilateral TM visualized without erythema or bulging. Cardio: Regular rate, *** rhythm, no murmurs on exam. Pulm: Clear, no wheezing, no crackles. No increased work of breathing. Abdominal: bowel sounds present, soft, non-tender, non-distended. No HSM. Extremities: no peripheral edema. Moves all extremities equally. Neuro: Alert and oriented x3, speech normal in content, no facial asymmetry, strength intact and equal bilaterally in UE and LE, pupils equal and reactive to light.  Psych:  Cognition and judgment appear intact. Alert, communicative, and cooperative with normal attention span and concentration. No apparent delusions, illusions, hallucinations    ASSESSMENT/PLAN:   Assessment & Plan  Annual Examination  See AVS for age appropriate recommendations.  PHQ score ***, reviewed and discussed.  Blood pressure value is *** goal, discussed.   Considered the following screening exams based upon USPSTF recommendations: Diabetes screening: {FMCANNUALORDERED:33692} HIV testing:{FMCANNUALORDERED:33692} Hepatitis C: {FMCANNUALORDERED:33692} Hepatitis B:{FMCANNUALORDERED:33692} Syphilis if at high risk: {FMCANNUALORDERED:33692} GC/CT {GC/CT screening :23818} Lipid panel (nonfasting or fasting) discussed based upon AHA recommendations and {FMCLIPID:33694}.  Consider repeat every 4-6 years.  Reviewed risk factors for latent tuberculosis and {not indicated/requested/declined:14582}  Cancer Screening Discussion  Lung cancer screening:{FMCLUNGCANCERSCREENIN:33695}.  See documentation below regarding  indications/risks/benefits.  Colorectal cancer screening: DUE {crcscreen:23821::discussed, colonoscopy ordered}.  PSA discussed and after engaging in discussion of possible risks, benefits and complications of screening. PSA: {not indicated/requested/declined:14582} Vaccinations ***.   Check A1c (last 6.3 in Jan 2025), BMP for long-term use of NSAIDs for knee pain.  Follow up in 1  year or sooner if indicated.  MyChart Activation: Already signed up  Lauraine Norse, DO [facility]  "

## 2024-03-22 ENCOUNTER — Encounter: Payer: Self-pay | Admitting: Family Medicine

## 2024-03-22 ENCOUNTER — Ambulatory Visit: Payer: Self-pay | Admitting: Family Medicine

## 2024-03-22 VITALS — BP 110/70 | HR 60 | Ht 75.0 in | Wt 227.0 lb

## 2024-03-22 DIAGNOSIS — M25531 Pain in right wrist: Secondary | ICD-10-CM | POA: Diagnosis not present

## 2024-03-22 DIAGNOSIS — Z1211 Encounter for screening for malignant neoplasm of colon: Secondary | ICD-10-CM

## 2024-03-22 DIAGNOSIS — R7309 Other abnormal glucose: Secondary | ICD-10-CM

## 2024-03-22 DIAGNOSIS — M25521 Pain in right elbow: Secondary | ICD-10-CM

## 2024-03-22 DIAGNOSIS — Z23 Encounter for immunization: Secondary | ICD-10-CM

## 2024-03-22 DIAGNOSIS — Z0000 Encounter for general adult medical examination without abnormal findings: Secondary | ICD-10-CM

## 2024-03-22 DIAGNOSIS — R7303 Prediabetes: Secondary | ICD-10-CM | POA: Diagnosis not present

## 2024-03-22 LAB — POCT GLYCOSYLATED HEMOGLOBIN (HGB A1C): HbA1c, POC (prediabetic range): 6.2 % (ref 5.7–6.4)

## 2024-03-22 NOTE — Patient Instructions (Addendum)
 It was so good to see you today! Thank you for allowing me to take care of you.  Today we discussed the following concerns and plans:  Wrist and arm pain - try wearing a wrist brace to help support your wrist - ice your writs and elbow twice a day for 10-15 minutes - you can try topical creams/gel like tiger balm, icy hot, biofreeze, etc on your elbow if this seems to help    We are checking some labs today. I will let you know the results. You got your flu shot today.  Follow up with Sports Medicine for your knee.  If you have any concerns, please call the clinic or schedule an appointment.  It was a pleasure to take care of you today. Be well!  Lauraine Norse, DO [facility], PGY-2  Do you need your medications delivered to your home?   Well send your prescription to the [facility] for delivery.          Address: [address] [place], [place], [place] [address]          Phone: [phone]  Please call the [facility] to speak with a pharmacist and set up your home medication delivery. If you have any questions, feel free to contact us  -- were happy to help!  Other [facility] that offer affordable prices on both prescriptions and over-the-counter items, as well as convenient services like vaccinations, are  [facility], at [facility]         Address:  [address], [place], [place] [address]         Phone: [phone]  [facility], located in the [facility]        Address: [address] [place], [place] [address]        Phone: [phone]  [facility], at [facility]       Address: [address], [place], [place] [address]       Phone: [phone]  [facility], at [facility]       Address: [address], [place], [place] [address]       Phone: [phone]

## 2024-03-23 ENCOUNTER — Ambulatory Visit: Payer: Self-pay | Admitting: Family Medicine

## 2024-03-23 LAB — LIPID PANEL
Chol/HDL Ratio: 4.2 ratio (ref 0.0–5.0)
Cholesterol, Total: 171 mg/dL (ref 100–199)
HDL: 41 mg/dL
LDL Chol Calc (NIH): 93 mg/dL (ref 0–99)
Triglycerides: 217 mg/dL — ABNORMAL HIGH (ref 0–149)
VLDL Cholesterol Cal: 37 mg/dL (ref 5–40)

## 2024-03-23 LAB — BASIC METABOLIC PANEL WITH GFR
BUN/Creatinine Ratio: 15 (ref 9–20)
BUN: 17 mg/dL (ref 6–24)
CO2: 21 mmol/L (ref 20–29)
Calcium: 9 mg/dL (ref 8.7–10.2)
Chloride: 101 mmol/L (ref 96–106)
Creatinine, Ser: 1.11 mg/dL (ref 0.76–1.27)
Glucose: 102 mg/dL — ABNORMAL HIGH (ref 70–99)
Potassium: 4 mmol/L (ref 3.5–5.2)
Sodium: 137 mmol/L (ref 134–144)
eGFR: 78 mL/min/1.73
